# Patient Record
Sex: Female | Born: 1970
Health system: Southern US, Community
[De-identification: ages and names within clinical notes are randomized; demographics above are authoritative.]

## PROBLEM LIST (undated history)

## (undated) HISTORY — PX: CYST EXCISION: SHX5701

---

## 1997-11-01 ENCOUNTER — Other Ambulatory Visit: Admission: RE | Admit: 1997-11-01 | Discharge: 1997-11-01 | Payer: Self-pay | Admitting: Obstetrics and Gynecology

## 1998-06-09 ENCOUNTER — Inpatient Hospital Stay (HOSPITAL_COMMUNITY): Admission: AD | Admit: 1998-06-09 | Discharge: 1998-06-11 | Payer: Self-pay | Admitting: Obstetrics and Gynecology

## 1998-08-04 ENCOUNTER — Other Ambulatory Visit: Admission: RE | Admit: 1998-08-04 | Discharge: 1998-08-04 | Payer: Self-pay | Admitting: Obstetrics and Gynecology

## 2000-08-01 ENCOUNTER — Other Ambulatory Visit: Admission: RE | Admit: 2000-08-01 | Discharge: 2000-08-01 | Payer: Self-pay | Admitting: Obstetrics and Gynecology

## 2001-02-22 ENCOUNTER — Inpatient Hospital Stay (HOSPITAL_COMMUNITY): Admission: AD | Admit: 2001-02-22 | Discharge: 2001-02-24 | Payer: Self-pay | Admitting: *Deleted

## 2001-05-06 ENCOUNTER — Other Ambulatory Visit: Admission: RE | Admit: 2001-05-06 | Discharge: 2001-05-06 | Payer: Self-pay | Admitting: Obstetrics and Gynecology

## 2013-03-12 ENCOUNTER — Ambulatory Visit (INDEPENDENT_AMBULATORY_CARE_PROVIDER_SITE_OTHER): Payer: Private Health Insurance - Indemnity | Admitting: Physician Assistant

## 2013-03-12 VITALS — BP 102/66 | HR 82 | Temp 98.9°F | Resp 18 | Ht 64.0 in | Wt 120.0 lb

## 2013-03-12 DIAGNOSIS — M549 Dorsalgia, unspecified: Secondary | ICD-10-CM

## 2013-03-12 DIAGNOSIS — R3915 Urgency of urination: Secondary | ICD-10-CM

## 2013-03-12 DIAGNOSIS — R39198 Other difficulties with micturition: Secondary | ICD-10-CM

## 2013-03-12 DIAGNOSIS — R300 Dysuria: Secondary | ICD-10-CM

## 2013-03-12 DIAGNOSIS — R309 Painful micturition, unspecified: Secondary | ICD-10-CM

## 2013-03-12 DIAGNOSIS — N23 Unspecified renal colic: Secondary | ICD-10-CM

## 2013-03-12 DIAGNOSIS — N390 Urinary tract infection, site not specified: Secondary | ICD-10-CM

## 2013-03-12 LAB — POCT URINALYSIS DIPSTICK
Ketones, UA: NEGATIVE
Protein, UA: 100
Spec Grav, UA: 1.02
pH, UA: 7.5

## 2013-03-12 LAB — POCT UA - MICROSCOPIC ONLY
Crystals, Ur, HPF, POC: NEGATIVE
Epithelial cells, urine per micros: NEGATIVE

## 2013-03-12 MED ORDER — MELOXICAM 15 MG PO TABS: 15.0000 mg | ORAL_TABLET | Freq: Every day | ORAL | Status: DC

## 2013-03-12 MED ORDER — CYCLOBENZAPRINE HCL 10 MG PO TABS: 10.0000 mg | ORAL_TABLET | Freq: Three times a day (TID) | ORAL | Status: DC | PRN

## 2013-03-12 MED ORDER — NITROFURANTOIN MONOHYD MACRO 100 MG PO CAPS: 100.0000 mg | ORAL_CAPSULE | Freq: Two times a day (BID) | ORAL | Status: DC

## 2013-03-12 NOTE — Patient Instructions (Addendum)
Begin taking the antibiotic as directed.  Drink plenty of water.  I am sending your urine out to be cultured at the lab and will let you know if we need to make any changes based on that.  Use the meloxicam once daily to help with pain and inflammation - do not take any additional ibuprofen, advil, or aleve with this medicine.  You can take Tylenol if you need additional pain relief.  Use the cyclobenzaprine every 8 hours as needed for muscle pain/spasm - this may make you sleepy, so be careful with your first dose.  It is ok to use only at night if it makes you too sleepy.  Continue using heat and doing gentle stretches.  Let us know if any symptoms are worsening or not improving.   Urinary Tract Infection Urinary tract infections (UTIs) can develop anywhere along your urinary tract. Your urinary tract is your body's drainage system for removing wastes and extra water. Your urinary tract includes two kidneys, two ureters, a bladder, and a urethra. Your kidneys are a pair of bean-shaped organs. Each kidney is about the size of your fist. They are located below your ribs, one on each side of your spine. CAUSES Infections are caused by microbes, which are microscopic organisms, including fungi, viruses, and bacteria. These organisms are so small that they can only be seen through a microscope. Bacteria are the microbes that most commonly cause UTIs. SYMPTOMS  Symptoms of UTIs may vary by age and gender of the patient and by the location of the infection. Symptoms in young women typically include a frequent and intense urge to urinate and a painful, burning feeling in the bladder or urethra during urination. Older women and men are more likely to be tired, shaky, and weak and have muscle aches and abdominal pain. A fever may mean the infection is in your kidneys. Other symptoms of a kidney infection include pain in your back or sides below the ribs, nausea, and vomiting. DIAGNOSIS To diagnose a UTI,  your caregiver will ask you about your symptoms. Your caregiver also will ask to provide a urine sample. The urine sample will be tested for bacteria and white blood cells. White blood cells are made by your body to help fight infection. TREATMENT  Typically, UTIs can be treated with medication. Because most UTIs are caused by a bacterial infection, they usually can be treated with the use of antibiotics. The choice of antibiotic and length of treatment depend on your symptoms and the type of bacteria causing your infection. HOME CARE INSTRUCTIONS  If you were prescribed antibiotics, take them exactly as your caregiver instructs you. Finish the medication even if you feel better after you have only taken some of the medication.  Drink enough water and fluids to keep your urine clear or pale yellow.  Avoid caffeine, tea, and carbonated beverages. They tend to irritate your bladder.  Empty your bladder often. Avoid holding urine for long periods of time.  Empty your bladder before and after sexual intercourse.  After a bowel movement, women should cleanse from front to back. Use each tissue only once. SEEK MEDICAL CARE IF:   You have back pain.  You develop a fever.  Your symptoms do not begin to resolve within 3 days. SEEK IMMEDIATE MEDICAL CARE IF:   You have severe back pain or lower abdominal pain.  You develop chills.  You have nausea or vomiting.  You have continued burning or discomfort with urination. MAKE SURE  YOU:   Understand these instructions.  Will watch your condition.  Will get help right away if you are not doing well or get worse. Document Released: 03/21/2005 Document Revised: 12/11/2011 Document Reviewed: 07/20/2011 [facility] Patient Information 2014 [place], [place].

## 2013-03-12 NOTE — Progress Notes (Signed)
Subjective:    Patient ID: Amber Pacheco, female    DOB: [birth year], 42 y.o.   MRN: [id number]  HPI    Amber Pacheco is a very pleasant 42 yr old female her with several concerns  (1)  Upper back pain x 3 wks - thinks maybe a pulled muscle.  Difficulty getting comfortable at night.  Pain with moving neck, shoulders.  Has been taking 600mg  ibuprofen with some relief.  States she is actually feeling better today but is very concerned that there could be something wrong internally.  NKI.  No numbness, tingling, weakness.  (2)  Bleeding, irritation of left nostril.  Very dry.  This also is feeling better, but she is concerned.  (3)  UTI symptoms x 3-4 days - has had UTIs before, last 1 yr ago.  Notes dysuria, frequency, urgency, some lower abd pain.  No hematuria, NV, FC, flank pain.  No vaginal symptoms.  Has been using Azo, cranberry juice with no relief of symptoms.   Review of Systems  Constitutional: Negative for fever and chills.  HENT: Positive for nosebleeds.   Respiratory: Negative.   Cardiovascular: Negative.   Gastrointestinal: Negative.   Genitourinary: Positive for dysuria, urgency and frequency. Negative for hematuria, flank pain and vaginal discharge.  Musculoskeletal: Positive for back pain.  Skin: Negative.   Neurological: Negative.        Objective:   Physical Exam  Vitals reviewed. Constitutional: She is oriented to person, place, and time. She appears well-developed and well-nourished. No distress.  HENT:  Head: Normocephalic and atraumatic.  Eyes: Conjunctivae are normal. No scleral icterus.  Cardiovascular: Normal rate, regular rhythm and normal heart sounds.   Pulmonary/Chest: Effort normal and breath sounds normal. She has no wheezes. She has no rales.  Abdominal: Soft. There is tenderness in the suprapubic area. There is no CVA tenderness.  Musculoskeletal:       Cervical back: She exhibits tenderness and spasm.       Back:  TTP over paraspinals upper back    Neurological: She is alert and oriented to person, place, and time. She has normal strength. No sensory deficit.  Skin: Skin is warm and dry.  Psychiatric: She has a normal mood and affect. Her behavior is normal.      Results for orders placed in visit on 03/12/13  POCT UA - MICROSCOPIC ONLY      Result Value Range   WBC, Ur, HPF, POC tntc     RBC, urine, microscopic tntc     Bacteria, U Microscopic trace     Mucus, UA neg     Epithelial cells, urine per micros neg     Crystals, Ur, HPF, POC neg     Casts, Ur, LPF, POC neg     Yeast, UA neg    POCT URINALYSIS DIPSTICK      Result Value Range   Color, UA yellow     Clarity, UA coudy     Glucose, UA neg     Bilirubin, UA neg     Ketones, UA neg     Spec Grav, UA 1.020     Blood, UA moderate     pH, UA 7.5     Protein, UA 100     Urobilinogen, UA 1.0     Nitrite, UA positive     Leukocytes, UA large (3+)         Assessment & Plan:  UTI (urinary tract infection) - Plan: Urine culture, nitrofurantoin, macrocrystal-monohydrate, (  MACROBID) 100 MG capsule  Burning with urination - Plan: POCT UA - Microscopic Only, POCT urinalysis dipstick  Pain with urination - Plan: POCT UA - Microscopic Only, POCT urinalysis dipstick  Urgency of urination - Plan: POCT UA - Microscopic Only, POCT urinalysis dipstick  Upper back pain - Plan: cyclobenzaprine (FLEXERIL) 10 MG tablet, meloxicam (MOBIC) 15 MG tablet   Amber Pacheco is a very pleasant 42 yr old female with UTI - will start macrobid.  Urine cx sent.  Push fluids.  RTC if worsening or not improving.  Suspect MSK upper back pain.  Pt concerned that there could be an internal problems, but exam is benign today, and pt admits she is feeling better than she has in the last 3 wks.  Will try Mobic and Flexeril prn. If symptoms worsen or do not improve, RTC.  Suspect dryness as cause of nosebleed, irritation.  Will try small amount of vaseline to left nare daily.

## 2013-08-03 ENCOUNTER — Ambulatory Visit: Payer: Private Health Insurance - Indemnity | Admitting: Internal Medicine

## 2013-08-03 VITALS — BP 104/68 | HR 72 | Temp 97.8°F | Resp 18 | Ht 64.5 in | Wt 128.9 lb

## 2013-08-03 DIAGNOSIS — R1310 Dysphagia, unspecified: Secondary | ICD-10-CM

## 2013-08-03 MED ORDER — IBUPROFEN 600 MG PO TABS: 600.0000 mg | ORAL_TABLET | Freq: Three times a day (TID) | ORAL | Status: DC | PRN

## 2013-08-03 MED ORDER — MELOXICAM 15 MG PO TABS: 15.0000 mg | ORAL_TABLET | Freq: Every day | ORAL | Status: DC

## 2013-08-03 NOTE — Progress Notes (Signed)
This chart was scribed for Ellamae Sia, MD by Luisa Dago, ED Scribe. This patient was seen in room 4 and the patient's care was started at 4:31 PM. Subjective:    Patient ID: Amber Pacheco, female    DOB: 1971/01/19, 43 y.o.   MRN: [id number]  Chief Complaint  Patient presents with  . Sore Throat    3 days     HPI HPI Comments: Amber Pacheco is a 43 y.o. female who presents to [facility] complaining of a sore throat that started 3 days ago. Pt states that the sore throat is exacerbated by eating solid foods. She states due to the pain she's been doing liquids. Pt is now reporting a "blister-like" bump in the back of her throat that she noticed this morning. She denies any sinus drainage, fever, chills, or viral symptoms. She states that the only symptoms she noticed before she noticed the bump in the back of her throat is a scab in both of her nostrils. Pt reports picking at the scab until it bled. She states that the scabs in her nose has since then resolved. Pt denies taking any OTC medications to alleviate her symptoms.  Pt is requesting 600 mg Ibuprofen.   There are no active problems to display for this patient.  No past medical history on file. No past surgical history on file. No Known Allergies Prior to Admission medications   Medication Sig Start Date End Date Taking? Authorizing Provider  cyclobenzaprine (FLEXERIL) 10 MG tablet Take 1 tablet (10 mg total) by mouth 3 (three) times daily as needed for muscle spasms. 03/12/13   Eleanore Delia Chimes, PA-C  meloxicam (MOBIC) 15 MG tablet Take 1 tablet (15 mg total) by mouth daily. 03/12/13   Godfrey Pick, PA-C   History   Social History  . Marital Status: Married    Spouse Name: N/A    Number of Children: N/A  . Years of Education: N/A   Occupational History  . Not on file.   Social History Main Topics  . Smoking status: Never Smoker   . Smokeless tobacco: Not on file  . Alcohol Use: No  . Drug Use:  No  . Sexual Activity: Not on file   Other Topics Concern  . Not on file   Social History Narrative  . No narrative on file     Review of Systems    Triage Vitals:BP 104/68  Pulse 72  Temp(Src) 97.8 F (36.6 C) (Oral)  Resp 18  Ht 5' 4.5" (1.638 m)  Wt 128 lb 14.4 oz (58.469 kg)  BMI 21.79 kg/m2  SpO2 99%  LMP 07/31/2013 Objective:   Physical Exam  Nursing note and vitals reviewed. Constitutional: She is oriented to person, place, and time. She appears well-developed and well-nourished. No distress.  HENT:  Head: Normocephalic and atraumatic.  Right Ear: External ear normal.  Left Ear: External ear normal.  Nose: Nose normal.  Mouth/Throat: Oropharynx is clear and moist.  On the right tonsil there is a .75 cm nodule with a smooth pink surface and no evidence of cellulitis or vesiculation or ulcer.   Eyes: Conjunctivae and EOM are normal.  Neck: Neck supple. No tracheal deviation present.  Cardiovascular: Normal rate.   Pulmonary/Chest: Effort normal. No respiratory distress.  Musculoskeletal: Normal range of motion.  Lymphadenopathy:    She has no cervical adenopathy.  Neurological: She is alert and oriented to person, place, and time.  Skin: Skin  is warm and dry.  Psychiatric: She has a normal mood and affect. Her behavior is normal.          Assessment & Plan:  Dysphagia maybe viral with incidental finding of tonsillar mucosal cyst or nodule ?type  To gargle 2x/d  Salt water and in 2 weeks if not resolved will have ENT evaluate   She wants med refills for prn muscle pain cause she doesn't exercise Meds ordered this encounter  Medications  . meloxicam (MOBIC) 15 MG tablet    Sig: Take 1 tablet (15 mg total) by mouth daily.    Dispense:  30 tablet    Refill:  0  . ibuprofen (ADVIL,MOTRIN) 600 MG tablet    Sig: Take 1 tablet (600 mg total) by mouth every 8 (eight) hours as needed.    Dispense:  30 tablet    Refill:  5   Told how to use properly and  stressed need for daily yoga/stretching and exercise I have completed the patient encounter in its entirety as documented by the scribe, with editing by me where necessary. Jarrin Staley P. Merla Richesoolittle, M.D.  I have completed the patient encounter in its entirety as documented by the scribe, with editing by me where necessary. Nakeisha Greenhouse P. Merla Richesoolittle, M.D.

## 2014-04-08 ENCOUNTER — Ambulatory Visit
Admission: RE | Admit: 2014-04-08 | Discharge: 2014-04-08 | Disposition: A | Discharge status: Home / Self Care | Payer: Managed Care, Other (non HMO) | Source: Ambulatory Visit | Attending: Family Medicine | Admitting: Family Medicine

## 2014-04-08 ENCOUNTER — Other Ambulatory Visit: Payer: Self-pay | Admitting: Family Medicine

## 2014-04-08 DIAGNOSIS — R52 Pain, unspecified: Secondary | ICD-10-CM

## 2016-06-27 ENCOUNTER — Ambulatory Visit (INDEPENDENT_AMBULATORY_CARE_PROVIDER_SITE_OTHER): Payer: 59 | Admitting: Emergency Medicine

## 2016-06-27 VITALS — BP 124/78 | HR 87 | Temp 98.7°F | Resp 17 | Ht 64.0 in | Wt 128.0 lb

## 2016-06-27 DIAGNOSIS — R05 Cough: Secondary | ICD-10-CM

## 2016-06-27 DIAGNOSIS — J069 Acute upper respiratory infection, unspecified: Secondary | ICD-10-CM

## 2016-06-27 DIAGNOSIS — J209 Acute bronchitis, unspecified: Secondary | ICD-10-CM | POA: Diagnosis not present

## 2016-06-27 DIAGNOSIS — R059 Cough, unspecified: Secondary | ICD-10-CM

## 2016-06-27 MED ORDER — PROMETHAZINE-DM 6.25-15 MG/5ML PO SYRP: 5.0000 mL | ORAL_SOLUTION | Freq: Four times a day (QID) | ORAL | 0 refills | Status: AC | PRN

## 2016-06-27 MED ORDER — AZITHROMYCIN 250 MG PO TABS: ORAL_TABLET | ORAL | 0 refills | Status: DC

## 2016-06-27 NOTE — Progress Notes (Signed)
Amber Pacheco 46 y.o.   Chief Complaint  Patient presents with  . Fatigue  . Cough  . Nasal Congestion  . Sore Throat    HISTORY OF PRESENT ILLNESS: This is a 46 y.o. female complaining of 1-2 week h/o flu-like symptoms followed by productive cough not getting better. Family members also sick.  URI   This is a new problem. The current episode started 1 to 4 weeks ago. The problem has been gradually worsening. There has been no fever. Associated symptoms include congestion, coughing, headaches, rhinorrhea, sinus pain and a sore throat. Pertinent negatives include no abdominal pain, chest pain, diarrhea, dysuria, ear pain, joint pain, nausea, neck pain, rash, vomiting or wheezing. She has tried acetaminophen for the symptoms.     Prior to Admission medications   Medication Sig Start Date End Date Taking? Authorizing Provider  ibuprofen (ADVIL,MOTRIN) 200 MG tablet Take 200 mg by mouth every 6 (six) hours as needed.   Yes Historical Provider, MD  Pseudoeph-Doxylamine-DM-APAP (NYQUIL PO) Take by mouth.   Yes Historical Provider, MD    No Known Allergies  There are no active problems to display for this patient.   No past medical history on file.  No past surgical history on file.  Social History   Social History  . Marital status: Married    Spouse name: N/A  . Number of children: N/A  . Years of education: N/A   Occupational History  . Not on file.   Social History Main Topics  . Smoking status: Never Smoker  . Smokeless tobacco: Not on file  . Alcohol use No  . Drug use: No  . Sexual activity: Not on file   Other Topics Concern  . Not on file   Social History Narrative  . No narrative on file    No family history on file.   Review of Systems  HENT: Positive for congestion, rhinorrhea, sinus pain and sore throat. Negative for ear pain.   Respiratory: Positive for cough. Negative for wheezing.   Cardiovascular: Negative for chest pain.  Gastrointestinal:  Negative for abdominal pain, diarrhea, nausea and vomiting.  Genitourinary: Negative for dysuria.  Musculoskeletal: Negative for joint pain and neck pain.  Skin: Negative for rash.  Neurological: Positive for headaches.  All other systems reviewed and are negative.  Vitals:   06/27/16 1653  BP: 124/78  Pulse: 87  Resp: 17  Temp: 98.7 F (37.1 C)     Physical Exam  Constitutional: She is oriented to person, place, and time. She appears well-developed and well-nourished.  HENT:  Head: Normocephalic and atraumatic.  Nose: Nose normal.  Mouth/Throat: Mucous membranes are normal. Posterior oropharyngeal erythema present.  Eyes: Conjunctivae and EOM are normal. Pupils are equal, round, and reactive to light.  Neck: Normal range of motion. Neck supple.  Cardiovascular: Normal rate, regular rhythm, normal heart sounds and intact distal pulses.   Pulmonary/Chest: Effort normal and breath sounds normal.  Abdominal: Soft. Bowel sounds are normal.  Musculoskeletal: Normal range of motion.  Neurological: She is oriented to person, place, and time.  Skin: Skin is warm and dry.  Psychiatric: She has a normal mood and affect. Her behavior is normal.  Nursing note and vitals reviewed.    ASSESSMENT & PLAN: Amber Pacheco was seen today for fatigue, cough, nasal congestion and sore throat.  Diagnoses and all orders for this visit:  Acute bronchitis, unspecified organism -     Care order/instruction:  Acute upper respiratory infection  Cough  Other orders -     azithromycin (ZITHROMAX) 250 MG tablet; Sig as indicated -     promethazine-dextromethorphan (PROMETHAZINE-DM) 6.25-15 MG/5ML syrup; Take 5 mLs by mouth 4 (four) times daily as needed for cough.    Patient Instructions       IF you received an x-ray today, you will receive an invoice from [facility]. Please contact [facility] at [phone] with questions or concerns regarding your invoice.   IF you  received labwork today, you will receive an invoice from [facility]. Please contact [facility] at [phone] with questions or concerns regarding your invoice.   Our billing staff will not be able to assist you with questions regarding bills from these companies.  You will be contacted with the lab results as soon as they are available. The fastest way to get your results is to activate your My Chart account. Instructions are located on the last page of this paperwork. If you have not heard from [place] regarding the results in 2 weeks, please contact this office.      Cough, Adult Introduction A cough helps to clear your throat and lungs. A cough may last only 2-3 weeks (acute), or it may last longer than 8 weeks (chronic). Many different things can cause a cough. A cough may be a sign of an illness or another medical condition. Follow these instructions at home:  Pay attention to any changes in your cough.  Take medicines only as told by your doctor.  If you were prescribed an antibiotic medicine, take it as told by your doctor. Do not stop taking it even if you start to feel better.  Talk with your doctor before you try using a cough medicine.  Drink enough fluid to keep your pee (urine) clear or pale yellow.  If the air is dry, use a cold steam vaporizer or humidifier in your home.  Stay away from things that make you cough at work or at home.  If your cough is worse at night, try using extra pillows to raise your head up higher while you sleep.  Do not smoke, and try not to be around smoke. If you need help quitting, ask your doctor.  Do not have caffeine.  Do not drink alcohol.  Rest as needed. Contact a doctor if:  You have new problems (symptoms).  You cough up yellow fluid (pus).  Your cough does not get better after 2-3 weeks, or your cough gets worse.  Medicine does not help your cough and you are not sleeping well.  You have pain that gets worse or pain that is not  helped with medicine.  You have a fever.  You are losing weight and you do not know why.  You have night sweats. Get help right away if:  You cough up blood.  You have trouble breathing.  Your heartbeat is very fast. This information is not intended to replace advice given to you by your health care provider. Make sure you discuss any questions you have with your health care provider. Document Released: 02/22/2011 Document Revised: 11/17/2015 Document Reviewed: 08/18/2014  2017 Elsevier  Acute Bronchitis, Adult Acute bronchitis is when air tubes (bronchi) in the lungs suddenly get swollen. The condition can make it hard to breathe. It can also cause these symptoms:  A cough.  Coughing up clear, yellow, or green mucus.  Wheezing.  Chest congestion.  Shortness of breath.  A fever.  Body aches.  Chills.  A sore throat. Follow these  instructions at home: Medicines  Take over-the-counter and prescription medicines only as told by your doctor.  If you were prescribed an antibiotic medicine, take it as told by your doctor. Do not stop taking the antibiotic even if you start to feel better. General instructions  Rest.  Drink enough fluids to keep your pee (urine) clear or pale yellow.  Avoid smoking and secondhand smoke. If you smoke and you need help quitting, ask your doctor. Quitting will help your lungs heal faster.  Use an inhaler, cool mist vaporizer, or humidifier as told by your doctor.  Keep all follow-up visits as told by your doctor. This is important. How is this prevented? To lower your risk of getting this condition again:  Wash your hands often with soap and water. If you cannot use soap and water, use hand sanitizer.  Avoid contact with people who have cold symptoms.  Try not to touch your hands to your mouth, nose, or eyes.  Make sure to get the flu shot every year. Contact a doctor if:  Your symptoms do not get better in 2 weeks. Get help  right away if:  You cough up blood.  You have chest pain.  You have very bad shortness of breath.  You become dehydrated.  You faint (pass out) or keep feeling like you are going to pass out.  You keep throwing up (vomiting).  You have a very bad headache.  Your fever or chills gets worse. This information is not intended to replace advice given to you by your health care provider. Make sure you discuss any questions you have with your health care provider. Document Released: 11/28/2007 Document Revised: 01/18/2016 Document Reviewed: 11/30/2015 Elsevier Interactive Patient Education  2017 [employer]      Edwina Barth, MD [facility]

## 2016-06-27 NOTE — Patient Instructions (Addendum)
IF you received an x-ray today, you will receive an invoice from [facility]. Please contact [facility] at [phone] with questions or concerns regarding your invoice.   IF you received labwork today, you will receive an invoice from [facility]. Please contact [facility] at [phone] with questions or concerns regarding your invoice.   Our billing staff will not be able to assist you with questions regarding bills from these companies.  You will be contacted with the lab results as soon as they are available. The fastest way to get your results is to activate your My Chart account. Instructions are located on the last page of this paperwork. If you have not heard from us regarding the results in 2 weeks, please contact this office.      Cough, Adult Introduction A cough helps to clear your throat and lungs. A cough may last only 2-3 weeks (acute), or it may last longer than 8 weeks (chronic). Many different things can cause a cough. A cough may be a sign of an illness or another medical condition. Follow these instructions at home:  Pay attention to any changes in your cough.  Take medicines only as told by your doctor.  If you were prescribed an antibiotic medicine, take it as told by your doctor. Do not stop taking it even if you start to feel better.  Talk with your doctor before you try using a cough medicine.  Drink enough fluid to keep your pee (urine) clear or pale yellow.  If the air is dry, use a cold steam vaporizer or humidifier in your home.  Stay away from things that make you cough at work or at home.  If your cough is worse at night, try using extra pillows to raise your head up higher while you sleep.  Do not smoke, and try not to be around smoke. If you need help quitting, ask your doctor.  Do not have caffeine.  Do not drink alcohol.  Rest as needed. Contact a doctor if:  You have new problems (symptoms).  You cough up yellow  fluid (pus).  Your cough does not get better after 2-3 weeks, or your cough gets worse.  Medicine does not help your cough and you are not sleeping well.  You have pain that gets worse or pain that is not helped with medicine.  You have a fever.  You are losing weight and you do not know why.  You have night sweats. Get help right away if:  You cough up blood.  You have trouble breathing.  Your heartbeat is very fast. This information is not intended to replace advice given to you by your health care provider. Make sure you discuss any questions you have with your health care provider. Document Released: 02/22/2011 Document Revised: 11/17/2015 Document Reviewed: 08/18/2014  2017 Elsevier  Acute Bronchitis, Adult Acute bronchitis is when air tubes (bronchi) in the lungs suddenly get swollen. The condition can make it hard to breathe. It can also cause these symptoms:  A cough.  Coughing up clear, yellow, or green mucus.  Wheezing.  Chest congestion.  Shortness of breath.  A fever.  Body aches.  Chills.  A sore throat. Follow these instructions at home: Medicines  Take over-the-counter and prescription medicines only as told by your doctor.  If you were prescribed an antibiotic medicine, take it as told by your doctor. Do not stop taking the antibiotic even if you start to feel better. General instructions  Rest.  Drink enough fluids to keep your pee (urine) clear or pale yellow.  Avoid smoking and secondhand smoke. If you smoke and you need help quitting, ask your doctor. Quitting will help your lungs heal faster.  Use an inhaler, cool mist vaporizer, or humidifier as told by your doctor.  Keep all follow-up visits as told by your doctor. This is important. How is this prevented? To lower your risk of getting this condition again:  Wash your hands often with soap and water. If you cannot use soap and water, use hand sanitizer.  Avoid contact with  people who have cold symptoms.  Try not to touch your hands to your mouth, nose, or eyes.  Make sure to get the flu shot every year. Contact a doctor if:  Your symptoms do not get better in 2 weeks. Get help right away if:  You cough up blood.  You have chest pain.  You have very bad shortness of breath.  You become dehydrated.  You faint (pass out) or keep feeling like you are going to pass out.  You keep throwing up (vomiting).  You have a very bad headache.  Your fever or chills gets worse. This information is not intended to replace advice given to you by your health care provider. Make sure you discuss any questions you have with your health care provider. Document Released: 11/28/2007 Document Revised: 01/18/2016 Document Reviewed: 11/30/2015 Elsevier Interactive Patient Education  2017 [employer].

## 2017-01-07 DIAGNOSIS — Z0000 Encounter for general adult medical examination without abnormal findings: Secondary | ICD-10-CM | POA: Diagnosis not present

## 2017-01-07 DIAGNOSIS — Z13220 Encounter for screening for lipoid disorders: Secondary | ICD-10-CM | POA: Diagnosis not present

## 2017-10-14 DIAGNOSIS — N924 Excessive bleeding in the premenopausal period: Secondary | ICD-10-CM | POA: Diagnosis not present

## 2017-10-14 DIAGNOSIS — R55 Syncope and collapse: Secondary | ICD-10-CM | POA: Diagnosis not present

## 2017-10-14 DIAGNOSIS — D649 Anemia, unspecified: Secondary | ICD-10-CM | POA: Diagnosis not present

## 2017-10-15 ENCOUNTER — Other Ambulatory Visit: Payer: Self-pay | Admitting: Family Medicine

## 2017-10-15 DIAGNOSIS — D649 Anemia, unspecified: Secondary | ICD-10-CM

## 2017-10-24 ENCOUNTER — Other Ambulatory Visit: Payer: Managed Care, Other (non HMO)

## 2017-10-24 DIAGNOSIS — D509 Iron deficiency anemia, unspecified: Secondary | ICD-10-CM | POA: Diagnosis not present

## 2017-10-24 DIAGNOSIS — S0101XA Laceration without foreign body of scalp, initial encounter: Secondary | ICD-10-CM | POA: Diagnosis not present

## 2017-11-06 DIAGNOSIS — N841 Polyp of cervix uteri: Secondary | ICD-10-CM | POA: Diagnosis not present

## 2017-11-06 DIAGNOSIS — Z01419 Encounter for gynecological examination (general) (routine) without abnormal findings: Secondary | ICD-10-CM | POA: Diagnosis not present

## 2017-11-06 DIAGNOSIS — D649 Anemia, unspecified: Secondary | ICD-10-CM | POA: Diagnosis not present

## 2017-11-06 DIAGNOSIS — N939 Abnormal uterine and vaginal bleeding, unspecified: Secondary | ICD-10-CM | POA: Diagnosis not present

## 2017-11-06 DIAGNOSIS — Z6821 Body mass index (BMI) 21.0-21.9, adult: Secondary | ICD-10-CM | POA: Diagnosis not present

## 2017-11-26 DIAGNOSIS — N924 Excessive bleeding in the premenopausal period: Secondary | ICD-10-CM | POA: Diagnosis not present

## 2018-01-09 DIAGNOSIS — N840 Polyp of corpus uteri: Secondary | ICD-10-CM | POA: Diagnosis not present

## 2018-01-09 DIAGNOSIS — Z3202 Encounter for pregnancy test, result negative: Secondary | ICD-10-CM | POA: Diagnosis not present

## 2018-01-09 DIAGNOSIS — N939 Abnormal uterine and vaginal bleeding, unspecified: Secondary | ICD-10-CM | POA: Diagnosis not present

## 2018-08-06 DIAGNOSIS — Z13220 Encounter for screening for lipoid disorders: Secondary | ICD-10-CM | POA: Diagnosis not present

## 2018-08-06 DIAGNOSIS — M545 Low back pain: Secondary | ICD-10-CM | POA: Diagnosis not present

## 2018-08-06 DIAGNOSIS — D509 Iron deficiency anemia, unspecified: Secondary | ICD-10-CM | POA: Diagnosis not present

## 2018-08-06 DIAGNOSIS — Z0000 Encounter for general adult medical examination without abnormal findings: Secondary | ICD-10-CM | POA: Diagnosis not present

## 2018-08-06 DIAGNOSIS — N924 Excessive bleeding in the premenopausal period: Secondary | ICD-10-CM | POA: Diagnosis not present

## 2018-08-06 DIAGNOSIS — Z131 Encounter for screening for diabetes mellitus: Secondary | ICD-10-CM | POA: Diagnosis not present

## 2018-09-08 DIAGNOSIS — R109 Unspecified abdominal pain: Secondary | ICD-10-CM | POA: Diagnosis not present

## 2018-09-08 DIAGNOSIS — R1010 Upper abdominal pain, unspecified: Secondary | ICD-10-CM | POA: Diagnosis not present

## 2018-10-30 DIAGNOSIS — R079 Chest pain, unspecified: Secondary | ICD-10-CM | POA: Diagnosis not present

## 2018-10-30 DIAGNOSIS — R1013 Epigastric pain: Secondary | ICD-10-CM | POA: Diagnosis not present

## 2019-03-12 ENCOUNTER — Other Ambulatory Visit: Payer: Self-pay | Admitting: Gastroenterology

## 2019-03-12 DIAGNOSIS — R1013 Epigastric pain: Secondary | ICD-10-CM

## 2019-03-23 ENCOUNTER — Ambulatory Visit
Admission: RE | Admit: 2019-03-23 | Discharge: 2019-03-23 | Disposition: A | Discharge status: Home / Self Care | Payer: 59 | Source: Ambulatory Visit | Attending: Gastroenterology | Admitting: Gastroenterology

## 2019-03-23 DIAGNOSIS — R1013 Epigastric pain: Secondary | ICD-10-CM

## 2019-10-07 ENCOUNTER — Emergency Department (HOSPITAL_COMMUNITY)
Admission: EM | Admit: 2019-10-07 | Discharge: 2019-10-08 | Disposition: A | Discharge status: Home / Self Care | Payer: 59 | Attending: Emergency Medicine | Admitting: Emergency Medicine

## 2019-10-07 ENCOUNTER — Emergency Department (HOSPITAL_COMMUNITY): Payer: 59

## 2019-10-07 ENCOUNTER — Other Ambulatory Visit: Payer: Self-pay

## 2019-10-07 ENCOUNTER — Encounter (HOSPITAL_COMMUNITY): Payer: Self-pay | Admitting: Emergency Medicine

## 2019-10-07 DIAGNOSIS — J189 Pneumonia, unspecified organism: Secondary | ICD-10-CM | POA: Diagnosis not present

## 2019-10-07 DIAGNOSIS — Z20822 Contact with and (suspected) exposure to covid-19: Secondary | ICD-10-CM | POA: Insufficient documentation

## 2019-10-07 DIAGNOSIS — R042 Hemoptysis: Secondary | ICD-10-CM | POA: Diagnosis present

## 2019-10-07 DIAGNOSIS — Z79899 Other long term (current) drug therapy: Secondary | ICD-10-CM | POA: Diagnosis not present

## 2019-10-07 LAB — CBC WITH DIFFERENTIAL/PLATELET
Abs Immature Granulocytes: 0.01 10*3/uL (ref 0.00–0.07)
Basophils Absolute: 0 10*3/uL (ref 0.0–0.1)
Basophils Relative: 1 %
Eosinophils Absolute: 0.1 10*3/uL (ref 0.0–0.5)
Eosinophils Relative: 2 %
HCT: 40.4 % (ref 36.0–46.0)
Hemoglobin: 13.2 g/dL (ref 12.0–15.0)
Immature Granulocytes: 0 %
Lymphocytes Relative: 30 %
Lymphs Abs: 2 10*3/uL (ref 0.7–4.0)
MCH: 25.9 pg — ABNORMAL LOW (ref 26.0–34.0)
MCHC: 32.7 g/dL (ref 30.0–36.0)
MCV: 79.2 fL — ABNORMAL LOW (ref 80.0–100.0)
Monocytes Absolute: 0.6 10*3/uL (ref 0.1–1.0)
Monocytes Relative: 8 %
Neutro Abs: 4 10*3/uL (ref 1.7–7.7)
Neutrophils Relative %: 59 %
Platelets: 180 10*3/uL (ref 150–400)
RBC: 5.1 MIL/uL (ref 3.87–5.11)
RDW: 13.8 % (ref 11.5–15.5)
WBC: 6.7 10*3/uL (ref 4.0–10.5)
nRBC: 0 % (ref 0.0–0.2)

## 2019-10-07 LAB — I-STAT BETA HCG BLOOD, ED (MC, WL, AP ONLY): I-stat hCG, quantitative: <5 m[IU]/mL (ref <5)

## 2019-10-07 LAB — APTT: aPTT: 33 seconds (ref 24–36)

## 2019-10-07 LAB — BASIC METABOLIC PANEL
Anion gap: 10 (ref 5–15)
BUN: 6 mg/dL (ref 6–20)
CO2: 26 mmol/L (ref 22–32)
Calcium: 9.4 mg/dL (ref 8.9–10.3)
Chloride: 103 mmol/L (ref 98–111)
Creatinine, Ser: 0.56 mg/dL (ref 0.44–1.00)
GFR calc Af Amer: >60 mL/min (ref >60)
GFR calc non Af Amer: >60 mL/min (ref >60)
Glucose, Bld: 91 mg/dL (ref 70–99)
Potassium: 3.4 mmol/L — ABNORMAL LOW (ref 3.5–5.1)
Sodium: 139 mmol/L (ref 135–145)

## 2019-10-07 LAB — PROTIME-INR
INR: 1 (ref 0.8–1.2)
Prothrombin Time: 13.6 seconds (ref 11.4–15.2)

## 2019-10-07 MED ORDER — IOHEXOL 350 MG/ML SOLN
75.0000 mL | Freq: Once | INTRAVENOUS | Status: AC | PRN
  Administered 2019-10-07: 23:00:00 75 mL via INTRAVENOUS

## 2019-10-07 NOTE — ED Triage Notes (Signed)
Pt reports she was seen today at urgent care for back and upper shoulder pain x 2 weeks. Prescribed muscle relaxer, took first dose and had a coughing spell that lead to her coughing up blood. No other episodes since.

## 2019-10-07 NOTE — Discharge Instructions (Addendum)
You were given a prescription for antibiotics to treat a possible pneumonia. Please take the antibiotic prescription fully.   You were also tested for the coronavirus.  This result will be available within the next 1 to 2 days.  If positive, the hospital will contact you.  If positive you also need to quarantine.  You will need to follow-up with the ear nose and throat doctor for further work-up  You will also need to follow-up with the pulmonary (lung) doctor for further work-up  Please call their offices to schedule an appointment for reassessment  Please monitor your symptoms closely, if you experience any chest pain, shortness of breath, or any continued coughing up of blood then you will need to return to the emergency department immediately.

## 2019-10-07 NOTE — ED Provider Notes (Signed)
[facility] EMERGENC[facility] DEPARTMENT Provider Note   CSN: [id number] Arrival date & time: 10/07/19  1600     History Chief Complaint  Patient presents with  . Cough    Amber Pacheco is a 49 y.o. female.  HPI   Patient is a 49 year old female who presents the emergency department today for evaluation of hemoptysis.  States that for the last 1 to 2 weeks she has had body aches and pain to the bilateral upper back muscles.  She was seen in urgent care today and diagnosed with muscle spasm and was started on Flexeril.  States that she took this medication in about 20 minutes after that she had an episode of hemoptysis following this she had 2 more episodes of gross hemoptysis.  She has never had similar symptoms in the past.  She denies any chest pain, shortness of breath, pleuritic pain. Denies leg pain/swelling, hemoptysis, recent surgery/trauma, recent long travel, hormone use, personal hx of cancer, or hx of DVT/PE.   Patient showed me a video on her phone that she took of her sputum and there were there $0.50 size areas of blood noted on the pavement.  States when she initially started having sxs she was tested for covid and it was negative.  History reviewed. No pertinent past medical history.  Patient Active Problem List   Diagnosis Date Noted  . Acute bronchitis 06/27/2016  . Acute upper respiratory infection 06/27/2016    History reviewed. No pertinent surgical history.   OB History   No obstetric history on file.     No family history on file.  Social History   Tobacco Use  . Smoking status: Never Smoker  Substance Use Topics  . Alcohol use: No  . Drug use: No    Home Medications Prior to Admission medications   Medication Sig Start Date End Date Taking? Authorizing Provider  Cyanocobalamin (VITAMIN B-12 PO) Take 1 tablet by mouth daily.   Yes [provider]  cyclobenzaprine (FLEXERIL) 5 MG tablet Take 5 mg by mouth 3 (three) times  daily. Take for 10 days   Yes [provider]  Ferrous Sulfate (IRON PO) Take 1 tablet by mouth daily.   Yes [provider]  Omega-3 Fatty Acids (FISH OIL PO) Take 1 tablet by mouth daily.   Yes [provider]  VITAMIN D PO Take 1 tablet by mouth daily.   Yes [provider]  doxycycline (VIBRAMYCIN) 100 MG capsule Take 1 capsule (100 mg total) by mouth 2 (two) times daily for 7 days. 10/08/19 10/15/19  Salote Weidmann S, PA-C    Allergies    Patient has no known allergies.  Review of Systems   Review of Systems  Constitutional: Negative for fever.  HENT: Negative for ear pain and sore throat.   Eyes: Negative for visual disturbance.  Respiratory: Positive for cough. Negative for shortness of breath.        Hemoptysis  Cardiovascular: Negative for chest pain, palpitations and leg swelling.  Gastrointestinal: Negative for abdominal pain, constipation, diarrhea, nausea and vomiting.  Genitourinary: Negative for dysuria and hematuria.  Musculoskeletal: Positive for back pain and myalgias.  Skin: Negative for rash.  Neurological: Negative for headaches.  All other systems reviewed and are negative.   Physical Exam Updated Vital Signs BP 114/67   Pulse 66   Temp 98 F (36.7 C) (Oral)   Resp 16   Ht 5\' 4"  (1.626 m)   Wt 55.8 kg  SpO2 98%   BMI 21.11 kg/m   Physical Exam Vitals and nursing note reviewed.  Constitutional:      General: She is not in acute distress.    Appearance: She is well-developed.  HENT:     Head: Normocephalic and atraumatic.     Nose: Nose normal.     Comments: No obvious epistaxis or dried blood    Mouth/Throat:     Mouth: Mucous membranes are moist.     Pharynx: No posterior oropharyngeal erythema.  Eyes:     Conjunctiva/sclera: Conjunctivae normal.  Cardiovascular:     Rate and Rhythm: Normal rate and regular rhythm.     Pulses: Normal pulses.     Heart sounds: Normal heart sounds. No murmur.  Pulmonary:       Effort: Pulmonary effort is normal. No respiratory distress.     Breath sounds: Normal breath sounds. No wheezing, rhonchi or rales.  Abdominal:     General: Bowel sounds are normal.     Palpations: Abdomen is soft.     Tenderness: There is no abdominal tenderness. There is no guarding or rebound.  Musculoskeletal:        General: No tenderness.     Cervical back: Neck supple.     Right lower leg: No edema.     Left lower leg: No edema.  Skin:    General: Skin is warm and dry.  Neurological:     Mental Status: She is alert.     ED Results / Procedures / Treatments   Labs (all labs ordered are listed, but only abnormal results are displayed) Labs Reviewed  CBC WITH DIFFERENTIAL/PLATELET - Abnormal; Notable for the following components:      Result Value   MCV 79.2 (*)    MCH 25.9 (*)    All other components within normal limits  BASIC METABOLIC PANEL - Abnormal; Notable for the following components:   Potassium 3.4 (*)    All other components within normal limits  SARS CORONAVIRUS 2 (TAT 6-24 HRS)  PROTIME-INR  APTT  HEPATIC FUNCTION PANEL  I-STAT BETA HCG BLOOD, ED ([facility], [facility], [facility] ONLY)    EKG None  Radiology CT Angio Chest PE W and/or Wo Contrast  Result Date: 10/07/2019 CLINICAL DATA:  Hemoptysis EXAM: CT ANGIOGRAPHY CHEST WITH CONTRAST TECHNIQUE: Multidetector CT imaging of the chest was performed using the standard protocol during bolus administration of intravenous contrast. Multiplanar CT image reconstructions and MIPs were obtained to evaluate the vascular anatomy. CONTRAST:  48mL OMNIPAQUE IOHEXOL 350 MG/ML SOLN COMPARISON:  None. FINDINGS: Cardiovascular: This is a technically adequate evaluation of the pulmonary vasculature. No filling defects or pulmonary emboli. Heart is unremarkable without pericardial effusion. Thoracic aorta is normal in caliber. Mediastinum/Nodes: No enlarged mediastinal, hilar, or axillary lymph nodes. Thyroid gland, trachea, and esophagus  demonstrate no significant findings. Lungs/Pleura: There is minimal right middle lobe ground-glass airspace disease, which could reflect infection, inflammation, or hemorrhage. No other airspace disease, effusion, or pneumothorax. Central airways are patent. Upper Abdomen: No acute abnormality. Musculoskeletal: No acute or destructive bony lesions. Reconstructed images demonstrate no additional findings. Review of the MIP images confirms the above findings. IMPRESSION: 1. No evidence of pulmonary embolus. 2. Minimal right middle lobe ground-glass airspace disease, differential includes infection, inflammation, or hemorrhage. Electronically Signed   By: Sharlet Salina M.D.   On: 10/07/2019 23:01    Procedures Procedures (including critical care time)  Medications Ordered in ED Medications  iohexol (OMNIPAQUE) 350 MG/ML injection 75  mL (75 mLs Intravenous Contrast Given 10/07/19 2244)    ED Course  I have reviewed the triage vital signs and the nursing notes.  Pertinent labs & imaging results that were available during my care of the patient were reviewed by me and considered in my medical decision making (see chart for details).  Clinical Course as of Oct 08 19  Wed Oct 07, 2019  [id number] 49 year old female here after a coughing spell in which she coughed up a moderate amount of blood.  It does not sound massive.  She has not had any further episodes since then.  Sats 100% .  Getting a CT PE study.  This is negative likely discharge follow-up with pulmonary.   [MB]    Clinical Course User Index [MB] Terrilee Files, MD   MDM Rules/Calculators/A&P                      49 y/o F presenting to the ED today for eval of hemoptysis that occurred PTA. Has never had sxs like this before. Does not have any known risk factors for PE. Denies h/o COVID or recent URI.   Will get labs, CTA to r/o PE  CBC w/o leukocytosis or anemia BMP with mild hypokalemia, otherwise reassuring Beta hcg  neg PT/INR/APTT are within normal limits.  CTA chest w/o evidence of PE. Minimal right middle lobe ground-glass airspace disease, differential includes infection, inflammation, or hemorrhage.  11:37 PM CONSULT With Dr. Karie Mainland with pulmonary who reviewed the CT scan. He states he would treat this as an infection. He does not think that the patient needs an emergent bronchoscopy. He states he would have pt f/u as an outpatient with pulmonary and ENT.   Reassessed patient.  She is in no acute distress.  She has had no further episodes of hemoptysis while in the ED. We discussed the findings and plan for discharge with antibiotics and follow-up with pulmonary and ENT.    Final Clinical Impression(s) / ED Diagnoses Final diagnoses:  Hemoptysis  Community acquired pneumonia of right lung, unspecified part of lung    Rx / DC Orders ED Discharge Orders         Ordered    doxycycline (VIBRAMYCIN) 100 MG capsule  2 times daily     10/08/19 0003           Andrian Urbach S, PA-C 10/08/19 0021    Terrilee Files, MD 10/08/19 1020

## 2019-10-08 DIAGNOSIS — J189 Pneumonia, unspecified organism: Secondary | ICD-10-CM | POA: Diagnosis not present

## 2019-10-08 LAB — HEPATIC FUNCTION PANEL
ALT: 20 U/L (ref 0–44)
AST: 24 U/L (ref 15–41)
Albumin: 3.8 g/dL (ref 3.5–5.0)
Alkaline Phosphatase: 62 U/L (ref 38–126)
Bilirubin, Direct: <0.1 mg/dL (ref 0.0–0.2)
Total Bilirubin: 0.8 mg/dL (ref 0.3–1.2)
Total Protein: 7.5 g/dL (ref 6.5–8.1)

## 2019-10-08 LAB — SARS CORONAVIRUS 2 (TAT 6-24 HRS): SARS Coronavirus 2: NEGATIVE

## 2019-10-08 MED ORDER — DOXYCYCLINE HYCLATE 100 MG PO CAPS: 100.0000 mg | ORAL_CAPSULE | Freq: Two times a day (BID) | ORAL | 0 refills | Status: AC

## 2019-10-08 NOTE — ED Notes (Signed)
Patient verbalizes understanding of discharge instructions. Opportunity for questioning and answers were provided. Armband removed by staff, pt discharged from ED. Pt. ambulatory and discharged home.  

## 2019-10-29 ENCOUNTER — Other Ambulatory Visit: Payer: Self-pay

## 2019-10-29 ENCOUNTER — Encounter: Payer: Self-pay | Admitting: Pulmonary Disease

## 2019-10-29 ENCOUNTER — Ambulatory Visit (INDEPENDENT_AMBULATORY_CARE_PROVIDER_SITE_OTHER): Payer: 59 | Admitting: Pulmonary Disease

## 2019-10-29 VITALS — BP 116/72 | HR 82 | Temp 98.1°F | Ht 64.0 in | Wt 121.6 lb

## 2019-10-29 DIAGNOSIS — R042 Hemoptysis: Secondary | ICD-10-CM

## 2019-10-29 NOTE — Progress Notes (Addendum)
GREG ECKRICH    [id number]    1971-05-15  Primary Care Physician:Griffin, Consuella Lose, MD  Referring Physician: Maurice Small, MD [address] [place],  [place] [address]  Chief complaint:   Patient was evaluated recently in the emergency department for hemoptysis  HPI:  Was seen in the ED on 14 April for episode of hemoptysis She was not having any respiratory complaints prior to the episode of hemoptysis Had apparently had some shoulder upper back discomfort for couple of weeks, went to urgent care to be evaluated.  She was given some medication to take-Flexeril-she went to the store to get the medications filled, she did take 2 pills and on the way outside the store she felt like she needed to cough and coughed up some blood.  She had a recurrence while still at the parking lot and later on in the day when she got home she did have slightly altered blood expectoration. Did not have any respiratory complaints prior to that Did not have any nasal stuffiness or congestion, no history of epistaxis No history of GI problems, does not drink alcohol She did not have a cough or febrile illness prior to the episode  She was treated with a course of doxycycline at discharge from the emergency department  She has no underlying lung disease No family history of lung disease  Never smoker  No history of allergies  Since the episode of hemoptysis following which she was evaluated in the emergency department, has not had a recurrence  She still has some upper back discomfort  No skin rash  No changes in the usual environment  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 Cyanocobalamin (VITAMIN B-12 PO) Take 1 tablet by mouth daily.  . cyclobenzaprine (FLEXERIL) 5 MG tablet Take 5  mg by mouth 3 (three) times daily. Take for 10 days  . Ferrous Sulfate (IRON PO) Take 1 tablet by mouth daily.  . Omega-3 Fatty Acids (FISH OIL PO) Take 1 tablet by mouth daily.  Marland Kitchen VITAMIN D PO Take 1 tablet by mouth daily.   No facility-administered encounter medications on file as of 10/29/2019.    Allergies as of 10/29/2019  . (No Known Allergies)    History reviewed. No pertinent past medical history.  History reviewed. No pertinent surgical history.  History reviewed. No pertinent family history.  Social History   Socioeconomic History  . Marital status: Married    Spouse name: Not on file  . Number of children: Not on file  . Years of education: Not on file  . Highest education level: Not on file  Occupational History  . Not on file  Tobacco Use  . Smoking status: Never Smoker  . Smokeless tobacco: Never Used  Substance and Sexual Activity  . Alcohol use: No  . Drug use: No  . Sexual activity: Not on file  Other Topics Concern  . Not on file  Social History Narrative  . Not on file   Social Determinants of Health   Financial Resource Strain:   . Difficulty of Paying Living Expenses:   Food Insecurity:   . Worried About Programme researcher, broadcasting/film/video in the Last Year:   . [employer] of Food in the Last Year:  Transportation Needs:   . Film/video editor (Medical):   Marland Kitchen Lack of Transportation (Non-Medical):   Physical Activity:   . Days of Exercise per Week:   . Minutes of Exercise per Session:   Stress:   . Feeling of Stress :   Social Connections:   . Frequency of Communication with Friends and Family:   . Frequency of Social Gatherings with Friends and Family:   . Attends Religious Services:   . Active Member of Clubs or Organizations:   . Attends Archivist Meetings:   Marland Kitchen Marital Status:   Intimate Partner Violence:   . Fear of Current or Ex-Partner:   . Emotionally Abused:   Marland Kitchen Physically Abused:   . Sexually Abused:     Review of Systems    Constitutional: Negative for activity change, appetite change, fatigue and unexpected weight change.  HENT: Negative.   Respiratory: Negative for cough and shortness of breath.   Cardiovascular: Negative.   Gastrointestinal: Negative.     Vitals:   10/29/19 1155  BP: 116/72  Pulse: 82  Temp: 98.1 F (36.7 C)  SpO2: 100%     Physical Exam  Constitutional: She appears well-developed.  HENT:  Head: Normocephalic and atraumatic.  Eyes: Pupils are equal, round, and reactive to light.  Neck: No tracheal deviation present. No thyromegaly present.  Cardiovascular: Normal rate.  Pulmonary/Chest: Effort normal and breath sounds normal. No respiratory distress. She has no wheezes. She has no rales. She exhibits no tenderness.  Musculoskeletal:     Cervical back: Normal range of motion and neck supple.  Neurological: She is alert.  Skin: Skin is warm and dry.  Psychiatric: She has a normal mood and affect.   Data Reviewed: CT scan of the chest was reviewed with the patient showing infiltrative process in the right midlung  Assessment:  Hemoptysis  Abnormal CT scan of the chest showing infiltrative process in the right midlung -Did not have any symptoms suggesting an infectious process -This is likely related to aspirated blood -Was treated with doxycycline following ED visit  Plan/Recommendations: Repeat CT in 6 weeks to follow infiltrative process to resolution  Bronchoscopy was also discussed with the patient as the only way to ascertain with the blood may be coming from and to rule out any significant airway lesion that may have contributed to the bleeding -The yield is low as she does not have a history of smoking, no prior lung disease -No recurrence of the bleeding  Likelihood is an infectious process which she did not have, did not have a chronic cough and did not have a cough prior to onset of symptoms  We will follow in about 3 months  Encouraged to call with any  significant concerns  Sherrilyn Rist MD [facility] 10/29/2019, 1:15 PM  CC: Kelton Pillar, MD

## 2019-10-29 NOTE — Patient Instructions (Signed)
Hemoptysis  Repeat CT in 6 weeks from now to follow-up on the haziness in the lungs  Look up information on bronchoscopy-taking a look at your airway to assess for any areas that may be bleeding-the yield is low as we discussed but this is about the only way we can assess the breathing tubes  Call with any significant concerns  Follow-up in about 3 months Flexible Bronchoscopy  Flexible bronchoscopy is a procedure that is used to examine the passageways in the lungs. During the procedure, a thin, flexible tool with a camera on it (bronchoscope) is passed into the mouth or nose, down through the windpipe (trachea), and into the air tubes (bronchi) in the lungs. This tool allows your health care provider to look at your lungs from the inside and take testing (diagnostic) samples if needed. Tell a health care provider about:  Any allergies you have.  All medicines you are taking, including vitamins, herbs, eye drops, creams, and over-the-counter medicines.  Any problems you or family members have had with anesthetic medicines.  Any blood disorders you have.  Any surgeries you have had.  Any medical conditions you have.  Whether you are pregnant or may be pregnant. What are the risks? Generally, this is a safe procedure. However, problems may occur, including:  Infection.  Bleeding.  Damage to other structures or organs.  Allergic reactions to medicines.  Collapsed lung (pneumothorax).  Increased need for oxygen or difficulty breathing after the procedure. What happens before the procedure? Medicines Ask your health care provider about:  Changing or stopping your regular medicines. This is especially important if you are taking diabetes medicines or blood thinners.  Taking medicines such as aspirin and ibuprofen. These medicines can thin your blood. Do not take these medicines before your procedure if your health care provider instructs you not to. You may be given  antibiotic medicine to help prevent infection. Staying hydrated Follow instructions from your health care provider about hydration, which may include:  Up to 2 hours before the procedure - you may continue to drink clear liquids, such as water, clear fruit juice, black coffee, and plain tea. Eating and drinking Follow instructions from your health care provider about eating and drinking, which may include:  8 hours before the procedure - stop eating heavy meals or foods such as meat, fried foods, or fatty foods.  6 hours before the procedure - stop eating light meals or foods, such as toast or cereal.  6 hours before the procedure - stop drinking milk or drinks that contain milk.  2 hours before the procedure - stop drinking clear liquids. General instructions  Plan to have someone take you home from the hospital or clinic.  If you will be going home right after the procedure, plan to have someone with you for 24 hours. What happens during the procedure?  To lower your risk of infection: ? Your health care team will wash or sanitize their hands. ? Your skin will be washed with soap.  An IV tube will be inserted into one of your veins.  You will be given a medicine (local anesthetic) to numb your mouth, nose, throat, and voice box (larynx). You may also be given one or more of the following: ? A medicine to help you relax (sedative). ? A medicine to control coughing. ? A medicine to dry up any fluids in your lungs (secretions).  A bronchoscope will be passed into your nose or mouth, and into your lungs. Your  health care provider will examine your lungs.  Samples of airway secretions may be collected for testing.  If abnormal areas are seen in your airways, tissue samples may be removed for examination under a microscope (biopsy).  If tissue samples are needed from the outer parts of the lung, a type of X-ray (fluoroscopy) may be used to guide the bronchoscope to these  areas.  If bleeding occurs, you may be given medicine to stop or decrease the bleeding. The procedure may vary among health care providers and hospitals. What happens after the procedure?  Do not drive for 24 hours if you were given a sedative.  Your blood pressure, heart rate, breathing rate, and blood oxygen level will be monitored until the medicines you were given have worn off.  You may have a chest X-ray to check for signs of pneumothorax.  You will not be allowed to eat or drink anything for 2 hours after your procedure.  If a biopsy was taken, it is up to you to get the results of your procedure. Ask your health care provider, or the department that is doing the procedure, when your results will be ready. Summary  Flexible bronchoscopy is a procedure that allows your health care provider to look closely at your lungs from the inside and take testing (diagnostic) samples if needed.  Risks of flexible bronchoscopy include bleeding, infection, and pneumothorax.  Before a flexible bronchoscopy, you will be given a medicine (local anesthetic) to numb your mouth, nose, throat, and voice box (larynx). Then, a bronchoscope will be passed into your nose or mouth, and into your lungs.  After the procedure, your blood pressure, heart rate, breathing rate, and blood oxygen level will be monitored until the medicines you were given have worn off. You may have a chest X-ray to check for signs of pneumothorax.  You will not be allowed to eat or drink anything for 2 hours after your procedure. This information is not intended to replace advice given to you by your health care provider. Make sure you discuss any questions you have with your health care provider. Document Revised: 05/24/2017 Document Reviewed: 07/14/2016 Elsevier Patient Education  2020 [employer].

## 2019-11-03 ENCOUNTER — Other Ambulatory Visit (HOSPITAL_COMMUNITY): Payer: 59

## 2019-11-05 ENCOUNTER — Telehealth: Payer: Self-pay | Admitting: Pulmonary Disease

## 2019-11-05 NOTE — Telephone Encounter (Signed)
I believe [facility] sent this back to them already

## 2019-11-05 NOTE — Telephone Encounter (Signed)
Called and spoke with pt who was wanting to know if we had received anything from [facility] Dr. Bedelia Person about clearing her for surgery to have her gallbladder removed. Pt stated the surgery was originally scheduled for tomorrow 5/14 but it has been cancelled due to not receiving/hearing from our office.  Dr. Val Eagle or Allayne Butcher, please advise if you have received anything about this?

## 2019-11-05 NOTE — Telephone Encounter (Signed)
Yes [facility] has sent form. Nothing further needed at this time

## 2019-11-06 ENCOUNTER — Encounter (HOSPITAL_COMMUNITY): Admission: RE | Payer: Self-pay | Source: Home / Self Care

## 2019-11-06 ENCOUNTER — Ambulatory Visit (HOSPITAL_COMMUNITY): Admission: RE | Admit: 2019-11-06 | Payer: 59 | Source: Home / Self Care | Admitting: Surgery

## 2019-11-06 SURGERY — LAPAROSCOPIC CHOLECYSTECTOMY
Anesthesia: General

## 2019-11-11 ENCOUNTER — Ambulatory Visit: Payer: Self-pay | Admitting: Surgery

## 2019-12-02 ENCOUNTER — Other Ambulatory Visit: Payer: Self-pay

## 2019-12-02 ENCOUNTER — Ambulatory Visit (INDEPENDENT_AMBULATORY_CARE_PROVIDER_SITE_OTHER)
Admission: RE | Admit: 2019-12-02 | Discharge: 2019-12-02 | Disposition: A | Discharge status: Home / Self Care | Payer: 59 | Source: Ambulatory Visit | Attending: Pulmonary Disease | Admitting: Pulmonary Disease

## 2019-12-02 DIAGNOSIS — R042 Hemoptysis: Secondary | ICD-10-CM | POA: Diagnosis not present

## 2019-12-07 ENCOUNTER — Telehealth: Payer: Self-pay | Admitting: Pulmonary Disease

## 2019-12-07 DIAGNOSIS — J181 Lobar pneumonia, unspecified organism: Secondary | ICD-10-CM

## 2019-12-07 NOTE — Telephone Encounter (Signed)
Dr. Olalere please advise 

## 2019-12-08 NOTE — Telephone Encounter (Signed)
Called and left message that results of CT have not been reviewed yet. We will contact patient as soon as they are available.

## 2019-12-10 ENCOUNTER — Other Ambulatory Visit: Payer: 59

## 2019-12-14 NOTE — Telephone Encounter (Signed)
I did review the CT scan  Improved findings Most of the areas that look like a pneumonia have improved, there is some dilatation of the breathing tubes in this area  Nothing needs done at the present time but will need a CT scan in about 9 months to a year to follow to resolution

## 2019-12-14 NOTE — Addendum Note (Signed)
Addended by: Vianne Bulls R on: 12/14/2019 03:16 PM   Modules accepted: Orders

## 2020-01-07 ENCOUNTER — Other Ambulatory Visit (HOSPITAL_COMMUNITY)
Admission: RE | Admit: 2020-01-07 | Discharge: 2020-01-07 | Disposition: A | Discharge status: Home / Self Care | Payer: 59 | Source: Ambulatory Visit | Attending: Surgery | Admitting: Surgery

## 2020-01-07 DIAGNOSIS — Z20822 Contact with and (suspected) exposure to covid-19: Secondary | ICD-10-CM | POA: Diagnosis not present

## 2020-01-07 LAB — SARS CORONAVIRUS 2 (TAT 6-24 HRS): SARS Coronavirus 2: NEGATIVE

## 2020-01-08 NOTE — Progress Notes (Signed)
I was unable to reach patient by phone.  I left  A message on voice mail.  I instructed the patient to arrive at [facility] Main entrance at 0810, Monday, 01/11/20,register in the Admitting Office. DO NOT eat or drink anything after midnight.  I instructed the patient to take the following medications in the am with just enough water to get them down:none.  I asked patient to shower not wear any lotions, powders, cologne, jewelry, piercing, make-up or nail polish.  Wear clean clothes. Brush teeth. I informed patient that there will need to be a driver and someone to stay with him/her for the first 24 hours after surgery.   I instructed  patient to call [phone], in the am if there were any questions or problems.

## 2020-01-10 NOTE — Anesthesia Preprocedure Evaluation (Addendum)
Anesthesia Evaluation  Patient identified by MRN, date of birth, ID band Patient awake    Reviewed: Allergy & Precautions, NPO status , Patient's Chart, lab work & pertinent test results  History of Anesthesia Complications Negative for: history of anesthetic complications  Airway Mallampati: II  TM Distance: >3 FB Neck ROM: Full    Dental no notable dental hx. (+) Dental Advisory Given   Pulmonary neg pulmonary ROS,    Pulmonary exam normal        Cardiovascular negative cardio ROS Normal cardiovascular exam     Neuro/Psych negative neurological ROS     GI/Hepatic Neg liver ROS,   Endo/Other  negative endocrine ROS  Renal/GU negative Renal ROS     Musculoskeletal negative musculoskeletal ROS (+)   Abdominal   Peds  Hematology negative hematology ROS (+)   Anesthesia Other Findings   Reproductive/Obstetrics                            Anesthesia Physical Anesthesia Plan  ASA: II  Anesthesia Plan: General   Post-op Pain Management:    Induction: Intravenous  PONV Risk Score and Plan: 4 or greater and Ondansetron, Dexamethasone, Midazolam and Scopolamine patch - Pre-op  Airway Management Planned:   Additional Equipment:   Intra-op Plan:   Post-operative Plan: Extubation in OR  Informed Consent: I have reviewed the patients History and Physical, chart, labs and discussed the procedure including the risks, benefits and alternatives for the proposed anesthesia with the patient or authorized representative who has indicated his/her understanding and acceptance.     Dental advisory given  Plan Discussed with: Anesthesiologist, CRNA and Surgeon  Anesthesia Plan Comments:        Anesthesia Quick Evaluation

## 2020-01-11 ENCOUNTER — Encounter (HOSPITAL_COMMUNITY)
Admission: RE | Disposition: A | Discharge status: Home / Self Care | Payer: Self-pay | Source: Home / Self Care | Attending: Surgery

## 2020-01-11 ENCOUNTER — Encounter (HOSPITAL_COMMUNITY): Payer: Self-pay | Admitting: Surgery

## 2020-01-11 ENCOUNTER — Ambulatory Visit (HOSPITAL_COMMUNITY)
Admission: RE | Admit: 2020-01-11 | Discharge: 2020-01-11 | Disposition: A | Discharge status: Home / Self Care | Payer: 59 | Attending: Surgery | Admitting: Surgery

## 2020-01-11 ENCOUNTER — Ambulatory Visit (HOSPITAL_COMMUNITY): Payer: 59 | Admitting: Anesthesiology

## 2020-01-11 DIAGNOSIS — K8020 Calculus of gallbladder without cholecystitis without obstruction: Secondary | ICD-10-CM | POA: Diagnosis present

## 2020-01-11 DIAGNOSIS — K8010 Calculus of gallbladder with chronic cholecystitis without obstruction: Secondary | ICD-10-CM | POA: Insufficient documentation

## 2020-01-11 HISTORY — PX: CHOLECYSTECTOMY: SHX55

## 2020-01-11 SURGERY — LAPAROSCOPIC CHOLECYSTECTOMY
Anesthesia: General | Site: Abdomen

## 2020-01-11 MED ORDER — LIDOCAINE-EPINEPHRINE 1 %-1:100000 IJ SOLN
INTRAMUSCULAR | Status: AC
  Filled 2020-01-11: qty 1

## 2020-01-11 MED ORDER — PROPOFOL 10 MG/ML IV BOLUS
INTRAVENOUS | Status: DC | PRN
  Administered 2020-01-11: 180 mg via INTRAVENOUS

## 2020-01-11 MED ORDER — CHLORHEXIDINE GLUCONATE CLOTH 2 % EX PADS: 6.0000 | MEDICATED_PAD | Freq: Once | CUTANEOUS | Status: DC

## 2020-01-11 MED ORDER — IBUPROFEN 800 MG PO TABS
800.0000 mg | ORAL_TABLET | Freq: Three times a day (TID) | ORAL | 0 refills | Status: AC | PRN
Start: 2020-01-11 — End: ?

## 2020-01-11 MED ORDER — LIDOCAINE 2% (20 MG/ML) 5 ML SYRINGE
INTRAMUSCULAR | Status: AC
  Filled 2020-01-11: qty 5

## 2020-01-11 MED ORDER — FENTANYL CITRATE (PF) 100 MCG/2ML IJ SOLN
INTRAMUSCULAR | Status: DC | PRN
  Administered 2020-01-11 (×3): 50 ug via INTRAVENOUS

## 2020-01-11 MED ORDER — SCOPOLAMINE 1 MG/3DAYS TD PT72
MEDICATED_PATCH | TRANSDERMAL | Status: AC
  Filled 2020-01-11: qty 1

## 2020-01-11 MED ORDER — FENTANYL CITRATE (PF) 250 MCG/5ML IJ SOLN
INTRAMUSCULAR | Status: AC
  Filled 2020-01-11: qty 5

## 2020-01-11 MED ORDER — LIDOCAINE 2% (20 MG/ML) 5 ML SYRINGE
INTRAMUSCULAR | Status: DC | PRN
  Administered 2020-01-11: 60 mg via INTRAVENOUS

## 2020-01-11 MED ORDER — KETOROLAC TROMETHAMINE 30 MG/ML IJ SOLN
INTRAMUSCULAR | Status: AC
  Filled 2020-01-11: qty 1

## 2020-01-11 MED ORDER — PHENYLEPHRINE 40 MCG/ML (10ML) SYRINGE FOR IV PUSH (FOR BLOOD PRESSURE SUPPORT)
PREFILLED_SYRINGE | INTRAVENOUS | Status: AC
  Filled 2020-01-11: qty 10

## 2020-01-11 MED ORDER — ROCURONIUM BROMIDE 10 MG/ML (PF) SYRINGE
PREFILLED_SYRINGE | INTRAVENOUS | Status: AC
  Filled 2020-01-11: qty 10

## 2020-01-11 MED ORDER — KETOROLAC TROMETHAMINE 30 MG/ML IJ SOLN
INTRAMUSCULAR | Status: DC | PRN
  Administered 2020-01-11: 30 mg via INTRAVENOUS

## 2020-01-11 MED ORDER — DEXAMETHASONE SODIUM PHOSPHATE 10 MG/ML IJ SOLN
INTRAMUSCULAR | Status: DC | PRN
  Administered 2020-01-11: 6 mg via INTRAVENOUS

## 2020-01-11 MED ORDER — CHLORHEXIDINE GLUCONATE 0.12 % MT SOLN
15.0000 mL | Freq: Once | OROMUCOSAL | Status: AC
  Filled 2020-01-11: qty 15

## 2020-01-11 MED ORDER — LACTATED RINGERS IV SOLN: INTRAVENOUS | Status: DC

## 2020-01-11 MED ORDER — PROPOFOL 10 MG/ML IV BOLUS
INTRAVENOUS | Status: AC
  Filled 2020-01-11: qty 20

## 2020-01-11 MED ORDER — CHLORHEXIDINE GLUCONATE 0.12 % MT SOLN
OROMUCOSAL | Status: AC
  Administered 2020-01-11: 15 mL via OROMUCOSAL
  Filled 2020-01-11: qty 15

## 2020-01-11 MED ORDER — LIDOCAINE-EPINEPHRINE 1 %-1:100000 IJ SOLN
INTRAMUSCULAR | Status: DC | PRN
Start: 2020-01-11 — End: 2020-01-11
  Administered 2020-01-11: 20 mL

## 2020-01-11 MED ORDER — DEXAMETHASONE SODIUM PHOSPHATE 10 MG/ML IJ SOLN
INTRAMUSCULAR | Status: AC
  Filled 2020-01-11: qty 1

## 2020-01-11 MED ORDER — ONDANSETRON HCL 4 MG/2ML IJ SOLN
INTRAMUSCULAR | Status: AC
  Filled 2020-01-11: qty 2

## 2020-01-11 MED ORDER — BUPIVACAINE HCL (PF) 0.25 % IJ SOLN
INTRAMUSCULAR | Status: DC | PRN
  Administered 2020-01-11: 20 mL

## 2020-01-11 MED ORDER — LACTATED RINGERS IV SOLN: INTRAVENOUS | Status: DC | PRN

## 2020-01-11 MED ORDER — CEFAZOLIN SODIUM-DEXTROSE 2-4 GM/100ML-% IV SOLN
2.0000 g | INTRAVENOUS | Status: AC
  Administered 2020-01-11: 2 g via INTRAVENOUS

## 2020-01-11 MED ORDER — MIDAZOLAM HCL 5 MG/5ML IJ SOLN
INTRAMUSCULAR | Status: DC | PRN
  Administered 2020-01-11: 2 mg via INTRAVENOUS

## 2020-01-11 MED ORDER — ENOXAPARIN SODIUM 40 MG/0.4ML ~~LOC~~ SOLN
40.0000 mg | Freq: Once | SUBCUTANEOUS | Status: AC
  Administered 2020-01-11: 40 mg via SUBCUTANEOUS
  Filled 2020-01-11: qty 0.4

## 2020-01-11 MED ORDER — SUGAMMADEX SODIUM 200 MG/2ML IV SOLN
INTRAVENOUS | Status: DC | PRN
  Administered 2020-01-11: 200 mg via INTRAVENOUS

## 2020-01-11 MED ORDER — BUPIVACAINE HCL (PF) 0.25 % IJ SOLN
INTRAMUSCULAR | Status: AC
  Filled 2020-01-11: qty 30

## 2020-01-11 MED ORDER — SCOPOLAMINE 1 MG/3DAYS TD PT72
MEDICATED_PATCH | TRANSDERMAL | Status: DC | PRN
  Administered 2020-01-11: 1 via TRANSDERMAL

## 2020-01-11 MED ORDER — ROCURONIUM BROMIDE 10 MG/ML (PF) SYRINGE
PREFILLED_SYRINGE | INTRAVENOUS | Status: DC | PRN
  Administered 2020-01-11: 70 mg via INTRAVENOUS

## 2020-01-11 MED ORDER — STERILE WATER FOR IRRIGATION IR SOLN
Status: DC | PRN
  Administered 2020-01-11: 1000 mL

## 2020-01-11 MED ORDER — CEFAZOLIN SODIUM-DEXTROSE 2-4 GM/100ML-% IV SOLN
INTRAVENOUS | Status: AC
  Filled 2020-01-11: qty 100

## 2020-01-11 MED ORDER — ONDANSETRON HCL 4 MG/2ML IJ SOLN
INTRAMUSCULAR | Status: DC | PRN
  Administered 2020-01-11: 4 mg via INTRAVENOUS

## 2020-01-11 MED ORDER — 0.9 % SODIUM CHLORIDE (POUR BTL) OPTIME
TOPICAL | Status: DC | PRN
  Administered 2020-01-11: 1000 mL

## 2020-01-11 MED ORDER — OXYCODONE HCL 5 MG PO TABS: 5.0000 mg | ORAL_TABLET | Freq: Three times a day (TID) | ORAL | 0 refills | Status: AC | PRN

## 2020-01-11 MED ORDER — MIDAZOLAM HCL 2 MG/2ML IJ SOLN
INTRAMUSCULAR | Status: AC
  Filled 2020-01-11: qty 2

## 2020-01-11 SURGICAL SUPPLY — 41 items
APPLIER CLIP 5 13 M/L LIGAMAX5 (MISCELLANEOUS) ×2
BLADE CLIPPER SURG (BLADE) IMPLANT
CANISTER SUCT 3000ML PPV (MISCELLANEOUS) ×2 IMPLANT
CHLORAPREP W/TINT 26 (MISCELLANEOUS) ×2 IMPLANT
CLIP APPLIE 5 13 M/L LIGAMAX5 (MISCELLANEOUS) ×1 IMPLANT
COVER SURGICAL LIGHT HANDLE (MISCELLANEOUS) ×2 IMPLANT
DERMABOND ADVANCED (GAUZE/BANDAGES/DRESSINGS) ×1
DERMABOND ADVANCED .7 DNX12 (GAUZE/BANDAGES/DRESSINGS) ×1 IMPLANT
DISSECTOR BLUNT TIP ENDO 5MM (MISCELLANEOUS) IMPLANT
ELECT CAUTERY BLADE 6.4 (BLADE) ×2 IMPLANT
ELECT REM PT RETURN 9FT ADLT (ELECTROSURGICAL) ×2
ELECTRODE REM PT RTRN 9FT ADLT (ELECTROSURGICAL) ×1 IMPLANT
GLOVE BIO SURGEON STRL SZ 6 (GLOVE) ×2 IMPLANT
GLOVE BIO SURGEON STRL SZ 6.5 (GLOVE) ×4 IMPLANT
GLOVE BIOGEL PI IND STRL 6 (GLOVE) ×1 IMPLANT
GLOVE BIOGEL PI INDICATOR 6 (GLOVE) ×1
GLOVE ECLIPSE 7.5 STRL STRAW (GLOVE) ×2 IMPLANT
GLOVE SURG SIGNA 7.5 PF LTX (GLOVE) ×2 IMPLANT
GOWN STRL REUS W/ TWL LRG LVL3 (GOWN DISPOSABLE) ×3 IMPLANT
GOWN STRL REUS W/TWL LRG LVL3 (GOWN DISPOSABLE) ×6
KIT BASIN OR (CUSTOM PROCEDURE TRAY) ×2 IMPLANT
KIT TURNOVER KIT B (KITS) ×2 IMPLANT
L-HOOK LAP DISP 36CM (ELECTROSURGICAL) ×2
LHOOK LAP DISP 36CM (ELECTROSURGICAL) ×1 IMPLANT
NS IRRIG 1000ML POUR BTL (IV SOLUTION) ×2 IMPLANT
PAD ARMBOARD 7.5X6 YLW CONV (MISCELLANEOUS) ×2 IMPLANT
PENCIL BUTTON HOLSTER BLD 10FT (ELECTRODE) ×2 IMPLANT
POUCH SPECIMEN RETRIEVAL 10MM (ENDOMECHANICALS) ×2 IMPLANT
SCISSORS LAP 5X35 DISP (ENDOMECHANICALS) ×2 IMPLANT
SET IRRIG TUBING LAPAROSCOPIC (IRRIGATION / IRRIGATOR) ×2 IMPLANT
SET TUBE SMOKE EVAC HIGH FLOW (TUBING) ×2 IMPLANT
SLEEVE ENDOPATH XCEL 5M (ENDOMECHANICALS) ×4 IMPLANT
SPECIMEN JAR SMALL (MISCELLANEOUS) ×2 IMPLANT
SUT MNCRL AB 4-0 PS2 18 (SUTURE) ×2 IMPLANT
SUT VICRYL 0 AB UR-6 (SUTURE) ×4 IMPLANT
TOWEL GREEN STERILE (TOWEL DISPOSABLE) ×2 IMPLANT
TOWEL GREEN STERILE FF (TOWEL DISPOSABLE) ×2 IMPLANT
TRAY LAPAROSCOPIC MC (CUSTOM PROCEDURE TRAY) ×2 IMPLANT
TROCAR XCEL BLUNT TIP 100MML (ENDOMECHANICALS) ×2 IMPLANT
TROCAR XCEL NON-BLD 5MMX100MML (ENDOMECHANICALS) ×2 IMPLANT
WATER STERILE IRR 1000ML POUR (IV SOLUTION) ×2 IMPLANT

## 2020-01-11 NOTE — Anesthesia Postprocedure Evaluation (Signed)
Anesthesia Post Note  Patient: Amber Pacheco  Procedure(s) Performed: LAPAROSCOPIC CHOLECYSTECTOMY (N/A Abdomen)     Patient location during evaluation: PACU Anesthesia Type: General Level of consciousness: sedated Pain management: pain level controlled Vital Signs Assessment: post-procedure vital signs reviewed and stable Respiratory status: spontaneous breathing and respiratory function stable Cardiovascular status: stable Postop Assessment: no apparent nausea or vomiting Anesthetic complications: no   No complications documented.  Last Vitals:  Vitals:   01/11/20 1046 01/11/20 1101  BP: 115/69 118/73  Pulse: 80 78  Resp: 18 20  Temp:  36.7 C  SpO2: 98% 100%    Last Pain:  Vitals:   01/11/20 1101  PainSc: 0-No pain                 Rya Rausch DANIEL

## 2020-01-11 NOTE — Progress Notes (Signed)
Spoke to patient concerning menstrual history. Patient stated that last period was over a year ago. Patient is now labeled as postmenopausal.

## 2020-01-11 NOTE — Discharge Instructions (Signed)
May shower beginning 01/12/2020. Do not peel off or scrub skin glue. May allow warm soapy water to run over incision, then rinse and pat dry. Do not soak in any water (tubs, hot tubs, pools, lakes, oceans) for one week.   No lifting greater than 5 pounds for six weeks.   Call the office at [phone] for temperature greater than 101.5F, worsening pain, redness or warmth at the incision site.  Please call [phone] to make an appointment for 1 week after surgery for wound check.  

## 2020-01-11 NOTE — Op Note (Signed)
   Operative Note  Date: 01/11/2020  Procedure: laparoscopic cholecystectomy  Pre-op diagnosis: symptomatic cholelithiasis Post-op diagnosis: symptomatic cholelithiasis,  Chronic   cholecystitis  Indication and clinical history: The patient is a 49 y.o. year old female with symptomatic choleithiasis  Surgeon: Diamantina Monks, MD Assistant: Magnus Ivan, MD  Anesthesiologist: Krista Blue, MD Anesthesia: General  Findings:  . Specimen: gallbladder . EBL: <5cc . Drains/Implants: none  Disposition: PACU - hemodynamically stable.  Description of procedure: The patient was positioned supine on the operating room table. Time-out was performed verifying correct patient, procedure, signature of informed consent, and administration of pre-operative antibiotics, VTE prophylaxis with low molecular weight heparin. General anesthetic induction and intubation were uneventful. The abdomen was prepped and draped in the usual sterile fashion. An infra-umbilical incision was made using an open technique using zero vicryl stay sutures on either side of the fascia and a 43mm Hassan port inserted. After establishing pneumoperitoneum, which the patient tolerated well, the abdominal cavity was inspected and no injury of any intra-abdominal structures was identified. Additional ports were placed under direct visualization and using local anesthetic: two 6mm ports in the right subcostal region and a 21mm port in the epigastric region. The patient was re-positioned to reverse Trendelenburg and right side up. Adhesiolysis was performed to expose the gallbladder, which was then retracted cephalad. The infundibulum was identified and retracted toward the right lower quadrant. The peritoneum was incised over the infundibulum and the triangle of Calot dissected to expose the critical view of safety. With clear identification and isolation of the cystic duct and cystic artery, the cystic artery was doubly clipped and divided. After  this, the cystic duct was identified as a single structure entering the gallbladder, and was also doubly clipped and divided. The gallbladder was dissected off the liver bed using electrocautery and hemostasis of the liver bed was confirmed prior to separation of the final peritoneal attachments of the gallbladder to the liver bed. Two clips were used to obtain vascular hemostasis in the liver bed. The gallbladder fossa was irrigated and fluid returned clear. After transection of the final peritoneal attachments, the gallbladder was placed in an endoscopic specimen retrieval bag, removed via the umbilical port site, and sent to pathology as a permanent specimen. The gallbladder fossa was inspected confirming hemostasis, the absence of bile leakage from the cystic duct stump, and correct placement of clips on the cystic artery and cystic duct stumps. The abdomen was desufflated and the fascia of the umbilical port site was closed using the previously placed stay sutures. Additional local anesthetic was administered at the umbilical port site.  The skin of all incisions was closed with 4-0 monocryl. Sterile dressings were applied. All sponge and instrument counts were correct at the conclusion of the procedure. The patient was awakened from anesthesia, extubated uneventfully, and transported to the PACU - hemodynamically stable.. There were no complications.    Diamantina Monks, MD General and Trauma Surgery [facility]

## 2020-01-11 NOTE — H&P (Signed)
    Amber Pacheco is an 49 y.o. female.   HPI: 79F with symptomatic choellithiasis. The patient has had no hospitalizations, doctors visits, ER visits, surgeries, or newly diagnosed allergies since being seen in clinic.    History reviewed. No pertinent past medical history.  Past Surgical History:  Procedure Laterality Date  . CYST EXCISION      History reviewed. No pertinent family history.  Social History:  reports that she has never smoked. She has never used smokeless tobacco. She reports that she does not drink alcohol and does not use drugs.  Allergies: No Known Allergies  Medications: I have reviewed the patient's current medications.  No results found for this or any previous visit (from the past 48 hour(s)).  No results found.  ROS 10 point review of systems is negative except as listed above in HPI.   Physical Exam Blood pressure 137/71, pulse 79, temperature 98.5 F (36.9 C), resp. rate 16, height 5\' 4"  (1.626 m), last menstrual period 06/27/2016, SpO2 100 %. Constitutional: well-developed, well-nourished HEENT: pupils equal, round, reactive to light, 38mm b/l, moist conjunctiva, external inspection of ears and nose normal, hearing intact Oropharynx: normal oropharyngeal mucosa, normal dentition Neck: no thyromegaly, trachea midline, no midline cervical tenderness to palpation Chest: breath sounds equal bilaterally, normal respiratory effort, no midline or lateral chest wall tenderness to palpation/deformity Abdomen: soft, NT, no bruising, no hepatosplenomegaly GU: normal female genitalia  Back: no wounds, no thoracic/lumbar spine tenderness to palpation, no thoracic/lumbar spine stepoffs Rectal: deferred Extremities: 2+ radial and pedal pulses bilaterally, motor and sensation intact to bilateral UE and LE, no peripheral edema MSK: normal gait/station, no clubbing/cyanosis of fingers/toes, normal ROM of all four extremities Skin: warm, dry, no rashes Psych: normal  memory, normal mood/affect    Assessment/Plan: 86F with symptomatic cholelithiasis. Informed consent was obtained this morning after detailed explanation of risks, including bleeding, infection, biloma, need for IOC to delineate anatomy, and need for conversion to open procedure. Pictures used as aid in explanation. All questions answered to the patient's satisfaction.   3m, MD General and Trauma Surgery [facility]

## 2020-01-11 NOTE — Transfer of Care (Signed)
Immediate Anesthesia Transfer of Care Note  Patient: Amber Pacheco  Procedure(s) Performed: LAPAROSCOPIC CHOLECYSTECTOMY (N/A Abdomen)  Patient Location: PACU  Anesthesia Type:General  Level of Consciousness: drowsy  Airway & Oxygen Therapy: Patient Spontanous Breathing and Patient connected to face mask oxygen  Post-op Assessment: Report given to RN and Post -op Vital signs reviewed and stable  Post vital signs: Reviewed and stable  Last Vitals:  Vitals Value Taken Time  BP 114/52 01/11/20 1031  Temp    Pulse 80 01/11/20 1031  Resp 12 01/11/20 1031  SpO2 100 % 01/11/20 1031  Vitals shown include unvalidated device data.  Last Pain:  Vitals:   01/11/20 0845  PainSc: 0-No pain         Complications: No complications documented.

## 2020-01-11 NOTE — Anesthesia Procedure Notes (Signed)
Procedure Name: Intubation Date/Time: 01/11/2020 9:29 AM Performed by: Julian Reil, CRNA Pre-anesthesia Checklist: Patient identified, Emergency Drugs available, Suction available and Patient being monitored Patient Re-evaluated:Patient Re-evaluated prior to induction Oxygen Delivery Method: Circle system utilized Preoxygenation: Pre-oxygenation with 100% oxygen Induction Type: IV induction Ventilation: Mask ventilation without difficulty Laryngoscope Size: Miller and 3 Grade View: Grade I Tube type: Oral Tube size: 7.0 mm Number of attempts: 1 Airway Equipment and Method: Stylet Placement Confirmation: positive ETCO2,  ETT inserted through vocal cords under direct vision and breath sounds checked- equal and bilateral Secured at: 22 cm Tube secured with: Tape Dental Injury: Teeth and Oropharynx as per pre-operative assessment  Comments: 4x4s bite block used.

## 2020-01-12 ENCOUNTER — Encounter (HOSPITAL_COMMUNITY): Payer: Self-pay | Admitting: Surgery

## 2020-01-12 LAB — SURGICAL PATHOLOGY

## 2020-02-08 ENCOUNTER — Other Ambulatory Visit: Payer: Self-pay

## 2020-02-09 ENCOUNTER — Other Ambulatory Visit: Payer: Self-pay | Admitting: Surgery

## 2020-02-09 DIAGNOSIS — R1010 Upper abdominal pain, unspecified: Secondary | ICD-10-CM

## 2020-02-12 ENCOUNTER — Other Ambulatory Visit: Payer: Self-pay

## 2020-02-18 ENCOUNTER — Ambulatory Visit
Admission: RE | Admit: 2020-02-18 | Discharge: 2020-02-18 | Disposition: A | Discharge status: Home / Self Care | Payer: 59 | Source: Ambulatory Visit | Attending: Surgery | Admitting: Surgery

## 2020-02-18 DIAGNOSIS — R1010 Upper abdominal pain, unspecified: Secondary | ICD-10-CM

## 2020-04-30 ENCOUNTER — Other Ambulatory Visit (HOSPITAL_COMMUNITY)
Admission: RE | Admit: 2020-04-30 | Discharge: 2020-04-30 | Disposition: A | Discharge status: Home / Self Care | Payer: 59 | Source: Ambulatory Visit | Attending: Gastroenterology | Admitting: Gastroenterology

## 2020-04-30 DIAGNOSIS — Z01812 Encounter for preprocedural laboratory examination: Secondary | ICD-10-CM | POA: Insufficient documentation

## 2020-04-30 DIAGNOSIS — Z20822 Contact with and (suspected) exposure to covid-19: Secondary | ICD-10-CM | POA: Diagnosis not present

## 2020-04-30 LAB — SARS CORONAVIRUS 2 (TAT 6-24 HRS): SARS Coronavirus 2: NEGATIVE

## 2020-05-04 ENCOUNTER — Encounter (HOSPITAL_COMMUNITY)
Admission: RE | Disposition: A | Discharge status: Home / Self Care | Payer: Self-pay | Source: Home / Self Care | Attending: Gastroenterology

## 2020-05-04 ENCOUNTER — Ambulatory Visit (HOSPITAL_COMMUNITY)
Admission: RE | Admit: 2020-05-04 | Discharge: 2020-05-04 | Disposition: A | Discharge status: Home / Self Care | Payer: 59 | Attending: Gastroenterology | Admitting: Gastroenterology

## 2020-05-04 DIAGNOSIS — R1906 Epigastric swelling, mass or lump: Secondary | ICD-10-CM | POA: Diagnosis not present

## 2020-05-04 DIAGNOSIS — R1013 Epigastric pain: Secondary | ICD-10-CM | POA: Diagnosis not present

## 2020-05-04 DIAGNOSIS — K219 Gastro-esophageal reflux disease without esophagitis: Secondary | ICD-10-CM | POA: Diagnosis not present

## 2020-05-04 DIAGNOSIS — R1310 Dysphagia, unspecified: Secondary | ICD-10-CM | POA: Diagnosis not present

## 2020-05-04 HISTORY — PX: ESOPHAGEAL MANOMETRY: SHX5429

## 2020-05-04 SURGERY — MANOMETRY, ESOPHAGUS

## 2020-05-04 MED ORDER — LIDOCAINE VISCOUS HCL 2 % MT SOLN
OROMUCOSAL | Status: AC
  Filled 2020-05-04: qty 15

## 2020-05-04 SURGICAL SUPPLY — 2 items
FACESHIELD LNG OPTICON STERILE (SAFETY) IMPLANT
GLOVE BIO SURGEON STRL SZ8 (GLOVE) ×4 IMPLANT

## 2020-05-04 NOTE — Progress Notes (Signed)
Esophageal manometry performed per protocol.  Patient tolerated well.  Report to be sent to Dr. Kavitha Nandigam 

## 2020-05-08 ENCOUNTER — Encounter (HOSPITAL_COMMUNITY): Payer: Self-pay | Admitting: Gastroenterology

## 2020-05-18 DIAGNOSIS — R1906 Epigastric swelling, mass or lump: Secondary | ICD-10-CM

## 2020-05-18 DIAGNOSIS — R1310 Dysphagia, unspecified: Secondary | ICD-10-CM

## 2020-05-18 DIAGNOSIS — K219 Gastro-esophageal reflux disease without esophagitis: Secondary | ICD-10-CM

## 2020-08-31 ENCOUNTER — Other Ambulatory Visit: Payer: Self-pay

## 2020-08-31 ENCOUNTER — Ambulatory Visit (INDEPENDENT_AMBULATORY_CARE_PROVIDER_SITE_OTHER)
Admission: RE | Admit: 2020-08-31 | Discharge: 2020-08-31 | Disposition: A | Discharge status: Home / Self Care | Payer: 59 | Source: Ambulatory Visit | Attending: Pulmonary Disease | Admitting: Pulmonary Disease

## 2020-08-31 DIAGNOSIS — J181 Lobar pneumonia, unspecified organism: Secondary | ICD-10-CM | POA: Diagnosis not present

## 2021-03-22 ENCOUNTER — Telehealth: Payer: Self-pay | Admitting: Pulmonary Disease

## 2021-03-22 NOTE — Telephone Encounter (Signed)
Patient called to get results of her March 2022 CT scan. She wanted to know the comparison from her June 2021 CT scan. She states she feels fine, just wanting to know the results. I did not see the resulting note.  Dr. Wynona Neat please advise  Thank you

## 2021-03-27 NOTE — Telephone Encounter (Signed)
Did review the CT  We can consider repeating the CT because it did mention progression of inflammation-this can be ordered now since its been about 6 months  Nothing to be concerned about if she is feeling well  If she is coughing up any secretions, we can request for sample to be sent for mycobacteria  Be glad to see her as follow-up

## 2021-03-27 NOTE — Telephone Encounter (Signed)
Called and spoke with patient, provided information per Dr. Wynona Neat.  She states that she is feeling well at this time and she is not coughing up any mucous.  She would like to know how often the CT scan and follow up in the office should be done based on her last CT scan. Dr. Wynona Neat, please advise.  Thank you.

## 2021-03-30 NOTE — Telephone Encounter (Signed)
AO in office today. Will await F/U.

## 2021-03-30 NOTE — Telephone Encounter (Signed)
Called and spoke with patient, advised of information provided by Dr. Wynona Neat.  She verbalized understanding.  Nothing further needed.

## 2021-03-30 NOTE — Telephone Encounter (Signed)
There is no recommended schedule that needs to be followed  Once you perform CT scans over a couple of years- if they remain stable, you do not need to repeat     CT can always be done at any time if you have concerning symptoms that  one can attribute to having lung disease

## 2022-06-12 IMAGING — CT CT CHEST W/O CM
2 of 4 series · 15 of 36 positions shown, 18 images · non-contrast
Comparison: 12/02/2019

CLINICAL DATA: Pneumonia, follow-up examination

EXAM:
CT CHEST WITHOUT CONTRAST
TECHNIQUE: Multidetector CT imaging of the chest was performed following the
standard protocol without IV contrast.

[Series 2: thorax · axial · 0.55mm/px · z∈[-144,+132]mm · 12 of 164 slices shown, 15 images]
[im 13/164  mediastinal]
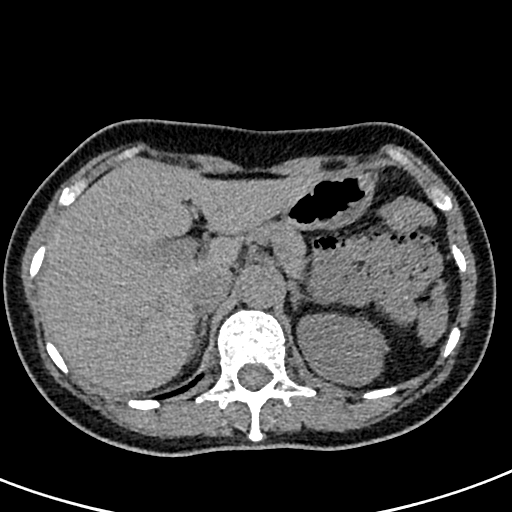
[im 13/164  lung]
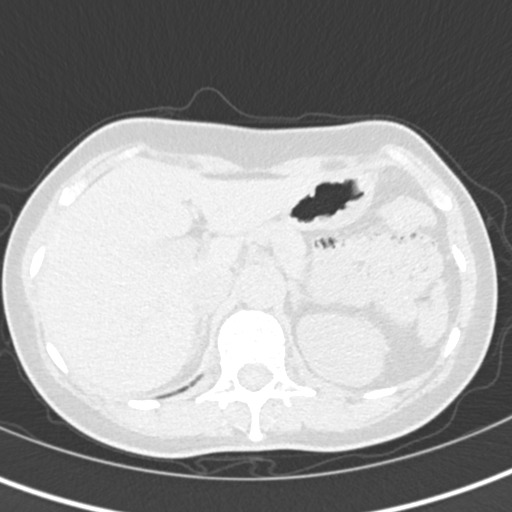
[im 26/164  lung]
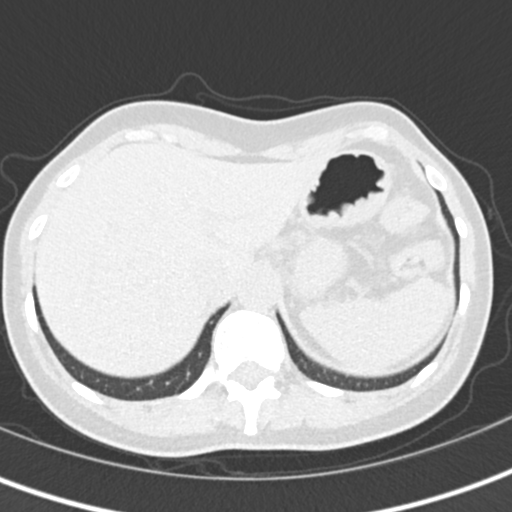
[im 38/164  lung]
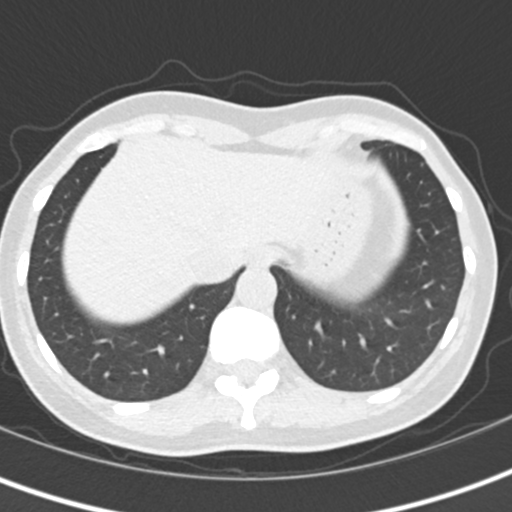
[im 51/164  lung]
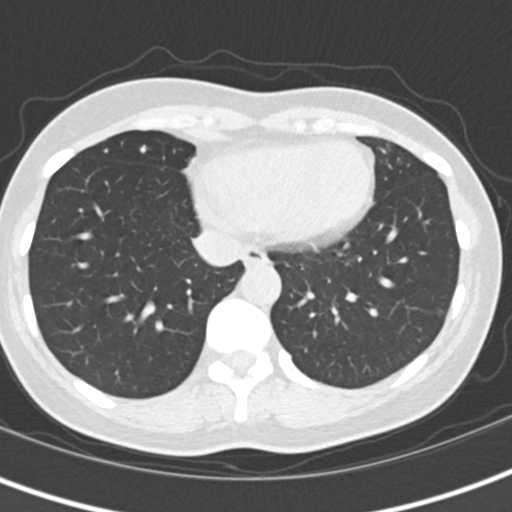
[im 63/164  mediastinal]
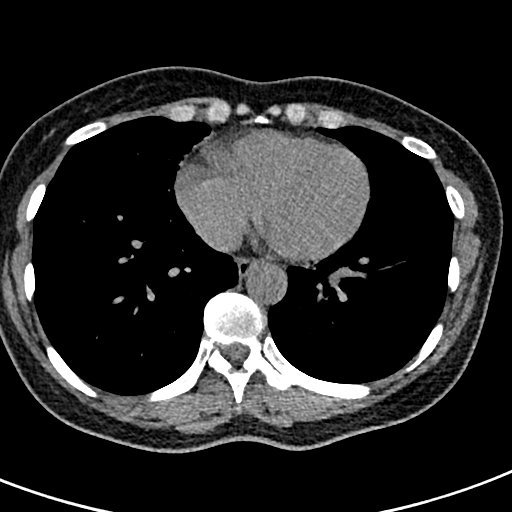
[im 63/164  lung]
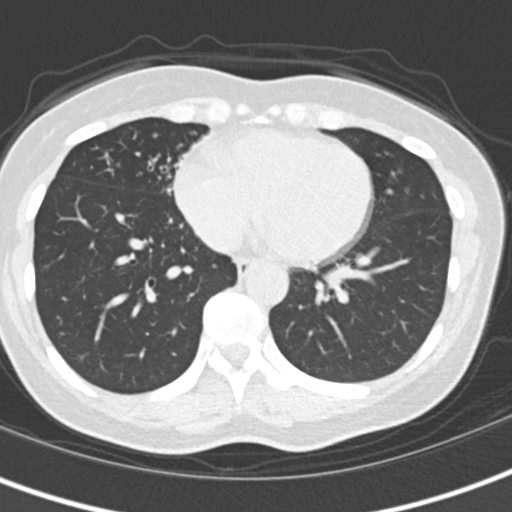
[im 76/164  lung]
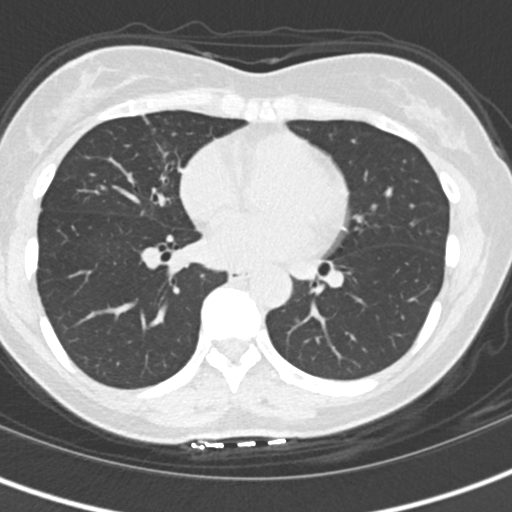
[im 88/164  lung]
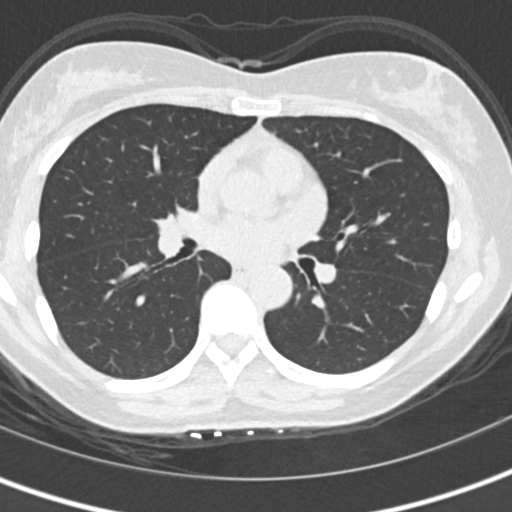
[im 101/164  lung]
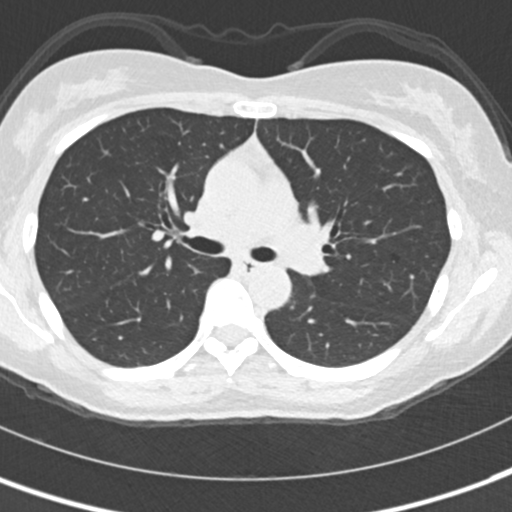
[im 113/164  mediastinal]
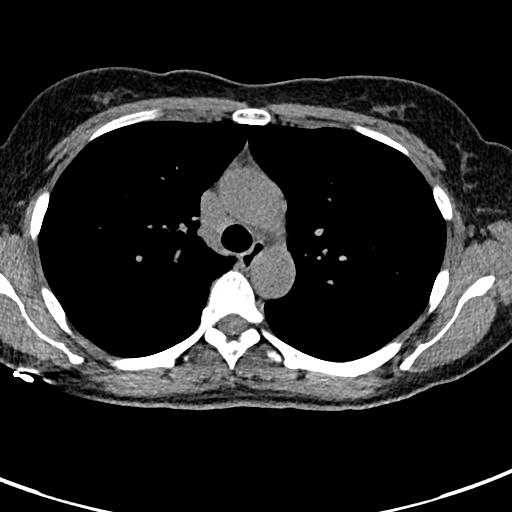
[im 113/164  lung]
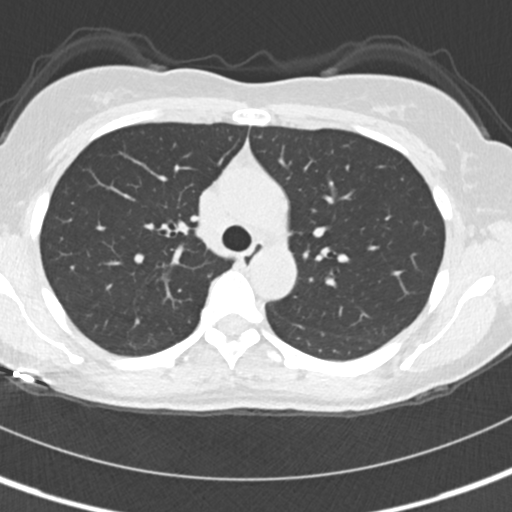
[im 126/164  lung]
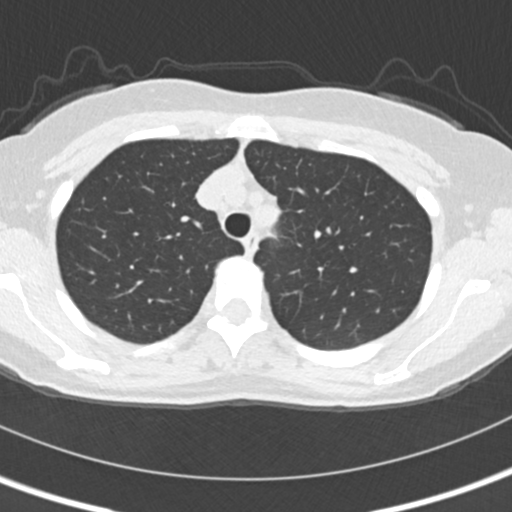
[im 138/164  lung]
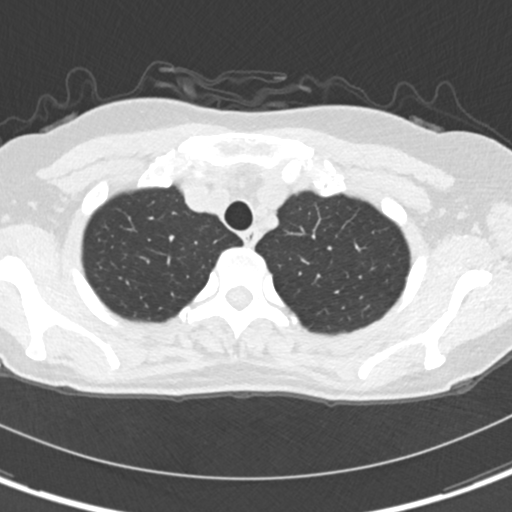
[im 151/164  lung]
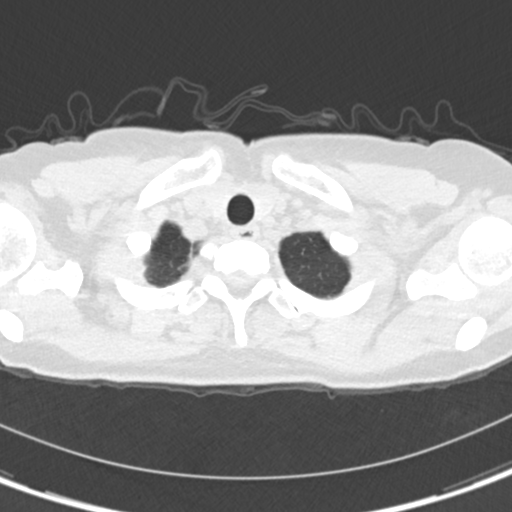

[Series 5: coronal · coronal · 0.64mm/px · 3 of 110 slices shown]
[im 22/110  lung]
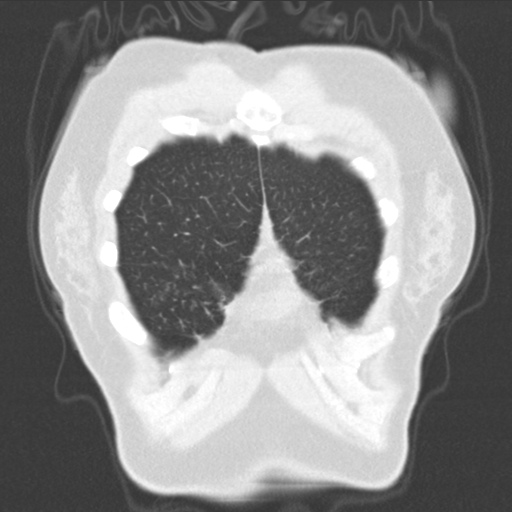
[im 44/110  lung]
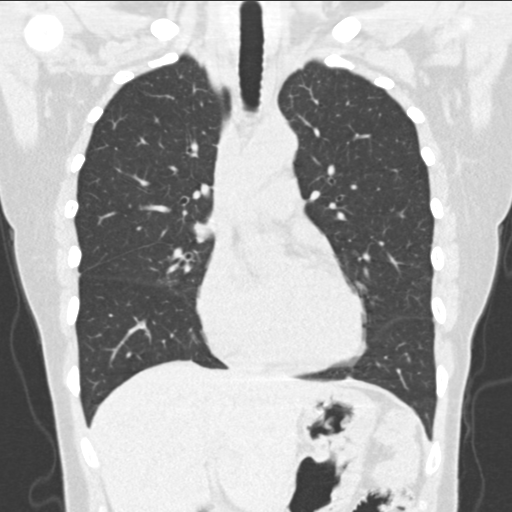
[im 66/110  lung]
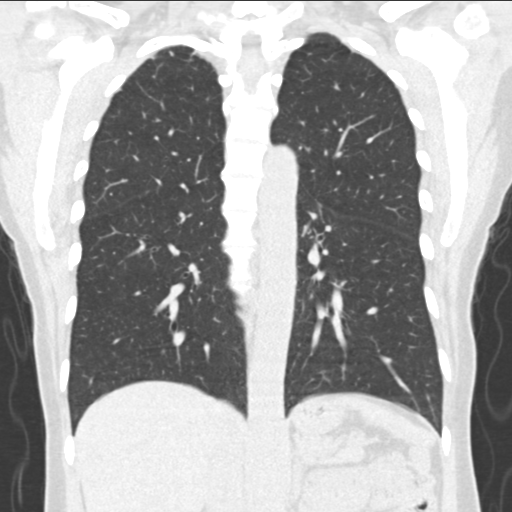

[15 of 36 positions shown; findings below may reference images not displayed]

FINDINGS: Cardiovascular: No significant vascular findings. Normal heart size.
No pericardial effusion. Central pulmonary arteries are of normal
caliber. The thoracic aorta is unremarkable.

Mediastinum/Nodes: No enlarged mediastinal or axillary lymph nodes.
Thyroid gland, trachea, and esophagus demonstrate no significant
findings.

Lungs/Pleura: There is stable bronchiectasis and peripheral mucous
plugging within the basilar right middle lobe. Peribronchial
nodularity in a tree-in-bud pattern has progressed in keeping with
bronchiolar inflammation and suggestive of a chronic atypical
infection such as mycobacterial or atypical fungal infection. The
lungs are otherwise clear. No pneumothorax or pleural effusion. The
central airways are widely patent.

Upper Abdomen: Cholecystectomy has been performed. Limited images
the upper abdomen are otherwise unremarkable.

Musculoskeletal: No chest wall mass or suspicious bone lesions
identified.
IMPRESSION: Stable right middle lobe bronchiectasis and airway impaction.
Progressive nodularity most in keeping with a chronic atypical
mycobacterial or fungal infection.

## 2022-11-29 ENCOUNTER — Emergency Department (HOSPITAL_COMMUNITY)
Admission: EM | Admit: 2022-11-29 | Discharge: 2022-11-29 | Discharge status: Against Medical Advice | Payer: 59 | Attending: Emergency Medicine | Admitting: Emergency Medicine

## 2022-11-29 DIAGNOSIS — K5900 Constipation, unspecified: Secondary | ICD-10-CM | POA: Diagnosis present

## 2022-11-29 DIAGNOSIS — Z5321 Procedure and treatment not carried out due to patient leaving prior to being seen by health care provider: Secondary | ICD-10-CM | POA: Insufficient documentation
